# Patient Record
Sex: Female | Born: 1975
Health system: Southern US, Community
[De-identification: ages and names within clinical notes are randomized; demographics above are authoritative.]

## PROBLEM LIST (undated history)

## (undated) ENCOUNTER — Emergency Department: Admission: EM | Payer: BLUE CROSS/BLUE SHIELD

## (undated) DIAGNOSIS — M25569 Pain in unspecified knee: Secondary | ICD-10-CM

## (undated) DIAGNOSIS — M797 Fibromyalgia: Secondary | ICD-10-CM

## (undated) DIAGNOSIS — N3010 Interstitial cystitis (chronic) without hematuria: Secondary | ICD-10-CM

## (undated) DIAGNOSIS — G8929 Other chronic pain: Secondary | ICD-10-CM

## (undated) DIAGNOSIS — F419 Anxiety disorder, unspecified: Secondary | ICD-10-CM

## (undated) DIAGNOSIS — IMO0002 Reserved for concepts with insufficient information to code with codable children: Secondary | ICD-10-CM

## (undated) DIAGNOSIS — M1990 Unspecified osteoarthritis, unspecified site: Secondary | ICD-10-CM

## (undated) HISTORY — PX: NASAL SEPTUM SURGERY: SHX37

## (undated) HISTORY — PX: CERVICAL BIOPSY  W/ LOOP ELECTRODE EXCISION: SUR135

## (undated) HISTORY — PX: BRAIN SURGERY: SHX531

## (undated) HISTORY — PX: NOSE SURGERY: SHX723

---

## 1999-06-18 ENCOUNTER — Other Ambulatory Visit: Admission: RE | Admit: 1999-06-18 | Discharge: 1999-06-18 | Payer: Self-pay | Admitting: *Deleted

## 1999-07-17 ENCOUNTER — Other Ambulatory Visit: Admission: RE | Admit: 1999-07-17 | Discharge: 1999-07-17 | Payer: Self-pay | Admitting: *Deleted

## 1999-09-11 ENCOUNTER — Encounter (INDEPENDENT_AMBULATORY_CARE_PROVIDER_SITE_OTHER): Payer: Self-pay | Admitting: Specialist

## 1999-09-11 ENCOUNTER — Other Ambulatory Visit: Admission: RE | Admit: 1999-09-11 | Discharge: 1999-09-11 | Payer: Self-pay | Admitting: *Deleted

## 2000-05-29 ENCOUNTER — Encounter: Admission: RE | Admit: 2000-05-29 | Discharge: 2000-05-29 | Payer: Self-pay | Admitting: Obstetrics

## 2000-07-21 ENCOUNTER — Encounter (INDEPENDENT_AMBULATORY_CARE_PROVIDER_SITE_OTHER): Payer: Self-pay | Admitting: Specialist

## 2000-07-21 ENCOUNTER — Ambulatory Visit (HOSPITAL_COMMUNITY): Admission: RE | Admit: 2000-07-21 | Discharge: 2000-07-21 | Payer: Self-pay | Admitting: Obstetrics

## 2000-10-14 ENCOUNTER — Encounter: Admission: RE | Admit: 2000-10-14 | Discharge: 2000-10-14 | Payer: Self-pay | Admitting: Internal Medicine

## 2000-12-02 ENCOUNTER — Encounter: Admission: RE | Admit: 2000-12-02 | Discharge: 2000-12-02 | Payer: Self-pay | Admitting: Obstetrics & Gynecology

## 2001-04-20 ENCOUNTER — Encounter: Payer: Self-pay | Admitting: Emergency Medicine

## 2001-04-20 ENCOUNTER — Emergency Department (HOSPITAL_COMMUNITY): Admission: EM | Admit: 2001-04-20 | Discharge: 2001-04-20 | Payer: Self-pay | Admitting: Emergency Medicine

## 2003-03-07 ENCOUNTER — Other Ambulatory Visit: Admission: RE | Admit: 2003-03-07 | Discharge: 2003-03-07 | Payer: Self-pay | Admitting: *Deleted

## 2005-03-06 ENCOUNTER — Other Ambulatory Visit: Admission: RE | Admit: 2005-03-06 | Discharge: 2005-03-06 | Payer: Self-pay | Admitting: *Deleted

## 2005-06-04 ENCOUNTER — Emergency Department (HOSPITAL_COMMUNITY): Admission: EM | Admit: 2005-06-04 | Discharge: 2005-06-04 | Payer: Self-pay | Admitting: Emergency Medicine

## 2008-06-15 ENCOUNTER — Other Ambulatory Visit: Admission: RE | Admit: 2008-06-15 | Discharge: 2008-06-15 | Payer: Self-pay | Admitting: Gynecology

## 2008-08-11 ENCOUNTER — Ambulatory Visit (HOSPITAL_COMMUNITY): Admission: RE | Admit: 2008-08-11 | Discharge: 2008-08-11 | Payer: Self-pay | Admitting: Sports Medicine

## 2008-09-28 ENCOUNTER — Emergency Department (HOSPITAL_BASED_OUTPATIENT_CLINIC_OR_DEPARTMENT_OTHER): Admission: EM | Admit: 2008-09-28 | Discharge: 2008-09-28 | Payer: Self-pay | Admitting: Emergency Medicine

## 2008-10-16 ENCOUNTER — Ambulatory Visit: Payer: Self-pay | Admitting: Interventional Radiology

## 2008-10-16 ENCOUNTER — Emergency Department (HOSPITAL_BASED_OUTPATIENT_CLINIC_OR_DEPARTMENT_OTHER): Admission: EM | Admit: 2008-10-16 | Discharge: 2008-10-16 | Payer: Self-pay | Admitting: Emergency Medicine

## 2008-11-10 ENCOUNTER — Encounter: Admission: RE | Admit: 2008-11-10 | Discharge: 2008-11-10 | Payer: Self-pay | Admitting: Sports Medicine

## 2008-11-28 ENCOUNTER — Encounter
Admission: RE | Admit: 2008-11-28 | Discharge: 2009-02-26 | Payer: Self-pay | Admitting: Physical Medicine & Rehabilitation

## 2008-11-28 ENCOUNTER — Ambulatory Visit: Payer: Self-pay | Admitting: Physical Medicine & Rehabilitation

## 2008-12-14 ENCOUNTER — Ambulatory Visit: Payer: Self-pay | Admitting: Physical Medicine & Rehabilitation

## 2008-12-21 ENCOUNTER — Encounter
Admission: RE | Admit: 2008-12-21 | Discharge: 2009-03-21 | Payer: Self-pay | Admitting: Physical Medicine & Rehabilitation

## 2009-01-04 ENCOUNTER — Encounter
Admission: RE | Admit: 2009-01-04 | Discharge: 2009-01-05 | Payer: Self-pay | Admitting: Physical Medicine & Rehabilitation

## 2009-01-05 ENCOUNTER — Ambulatory Visit: Payer: Self-pay | Admitting: Physical Medicine & Rehabilitation

## 2009-02-17 ENCOUNTER — Ambulatory Visit: Payer: Self-pay | Admitting: Physical Medicine & Rehabilitation

## 2009-03-21 ENCOUNTER — Emergency Department (HOSPITAL_BASED_OUTPATIENT_CLINIC_OR_DEPARTMENT_OTHER): Admission: EM | Admit: 2009-03-21 | Discharge: 2009-03-21 | Payer: Self-pay | Admitting: Emergency Medicine

## 2009-03-21 ENCOUNTER — Ambulatory Visit: Payer: Self-pay | Admitting: Diagnostic Radiology

## 2009-03-22 ENCOUNTER — Encounter
Admission: RE | Admit: 2009-03-22 | Discharge: 2009-06-20 | Payer: Self-pay | Admitting: Physical Medicine & Rehabilitation

## 2009-03-24 ENCOUNTER — Ambulatory Visit: Payer: Self-pay | Admitting: Physical Medicine & Rehabilitation

## 2009-05-22 ENCOUNTER — Ambulatory Visit: Payer: Self-pay | Admitting: Physical Medicine & Rehabilitation

## 2009-08-14 ENCOUNTER — Ambulatory Visit: Payer: Self-pay | Admitting: Physical Medicine & Rehabilitation

## 2009-08-14 ENCOUNTER — Encounter
Admission: RE | Admit: 2009-08-14 | Discharge: 2009-11-01 | Payer: Self-pay | Admitting: Physical Medicine & Rehabilitation

## 2009-09-12 ENCOUNTER — Ambulatory Visit: Payer: Self-pay | Admitting: Physical Medicine & Rehabilitation

## 2009-11-10 ENCOUNTER — Encounter
Admission: RE | Admit: 2009-11-10 | Discharge: 2010-02-08 | Payer: Self-pay | Admitting: Physical Medicine & Rehabilitation

## 2009-11-13 ENCOUNTER — Ambulatory Visit: Payer: Self-pay | Admitting: Physical Medicine & Rehabilitation

## 2010-03-05 ENCOUNTER — Encounter
Admission: RE | Admit: 2010-03-05 | Discharge: 2010-03-07 | Payer: Self-pay | Admitting: Physical Medicine & Rehabilitation

## 2010-03-07 ENCOUNTER — Ambulatory Visit: Payer: Self-pay | Admitting: Physical Medicine & Rehabilitation

## 2010-05-15 ENCOUNTER — Emergency Department (HOSPITAL_BASED_OUTPATIENT_CLINIC_OR_DEPARTMENT_OTHER): Admission: EM | Admit: 2010-05-15 | Discharge: 2010-05-15 | Payer: Self-pay | Admitting: Emergency Medicine

## 2010-05-15 ENCOUNTER — Ambulatory Visit: Payer: Self-pay | Admitting: Diagnostic Radiology

## 2010-05-16 ENCOUNTER — Encounter
Admission: RE | Admit: 2010-05-16 | Discharge: 2010-05-25 | Payer: Self-pay | Admitting: Physical Medicine & Rehabilitation

## 2010-05-25 ENCOUNTER — Ambulatory Visit: Payer: Self-pay | Admitting: Physical Medicine & Rehabilitation

## 2010-08-13 ENCOUNTER — Ambulatory Visit: Payer: Self-pay | Admitting: Diagnostic Radiology

## 2010-08-13 ENCOUNTER — Emergency Department (HOSPITAL_BASED_OUTPATIENT_CLINIC_OR_DEPARTMENT_OTHER): Admission: EM | Admit: 2010-08-13 | Discharge: 2010-08-13 | Payer: Self-pay | Admitting: Emergency Medicine

## 2010-09-13 ENCOUNTER — Encounter
Admission: RE | Admit: 2010-09-13 | Discharge: 2010-09-19 | Payer: Self-pay | Source: Home / Self Care | Attending: Physical Medicine & Rehabilitation | Admitting: Physical Medicine & Rehabilitation

## 2010-09-19 ENCOUNTER — Ambulatory Visit: Payer: Self-pay | Admitting: Physical Medicine & Rehabilitation

## 2010-11-21 ENCOUNTER — Ambulatory Visit: Admit: 2010-11-21 | Payer: Self-pay | Admitting: Physical Medicine & Rehabilitation

## 2010-12-21 ENCOUNTER — Encounter: Payer: PRIVATE HEALTH INSURANCE | Attending: Physical Medicine & Rehabilitation

## 2010-12-21 ENCOUNTER — Ambulatory Visit (HOSPITAL_BASED_OUTPATIENT_CLINIC_OR_DEPARTMENT_OTHER): Payer: PRIVATE HEALTH INSURANCE

## 2010-12-21 DIAGNOSIS — M545 Low back pain, unspecified: Secondary | ICD-10-CM | POA: Insufficient documentation

## 2010-12-21 DIAGNOSIS — IMO0001 Reserved for inherently not codable concepts without codable children: Secondary | ICD-10-CM

## 2010-12-21 DIAGNOSIS — G4700 Insomnia, unspecified: Secondary | ICD-10-CM | POA: Insufficient documentation

## 2010-12-21 DIAGNOSIS — F411 Generalized anxiety disorder: Secondary | ICD-10-CM | POA: Insufficient documentation

## 2010-12-21 DIAGNOSIS — M5382 Other specified dorsopathies, cervical region: Secondary | ICD-10-CM

## 2010-12-21 DIAGNOSIS — M5380 Other specified dorsopathies, site unspecified: Secondary | ICD-10-CM

## 2011-01-20 LAB — COMPREHENSIVE METABOLIC PANEL
AST: 27 U/L (ref 0–37)
Albumin: 4.2 g/dL (ref 3.5–5.2)
BUN: 12 mg/dL (ref 6–23)
CO2: 25 mEq/L (ref 19–32)
Creatinine, Ser: 1.1 mg/dL (ref 0.4–1.2)
GFR calc Af Amer: >60 mL/min (ref >60)
Sodium: 142 mEq/L (ref 135–145)

## 2011-01-20 LAB — URINALYSIS, ROUTINE W REFLEX MICROSCOPIC
Bilirubin Urine: NEGATIVE
Hgb urine dipstick: NEGATIVE
Ketones, ur: NEGATIVE mg/dL
Urobilinogen, UA: 0.2 mg/dL (ref 0.0–1.0)

## 2011-01-20 LAB — DIFFERENTIAL
Eosinophils Relative: 2 % (ref 0–5)
Lymphocytes Relative: 46 % (ref 12–46)
Lymphs Abs: 3.5 10*3/uL (ref 0.7–4.0)
Neutro Abs: 3.2 10*3/uL (ref 1.7–7.7)
Neutrophils Relative %: 44 % (ref 43–77)

## 2011-01-20 LAB — CBC
HCT: 38.2 % (ref 36.0–46.0)
Hemoglobin: 13.2 g/dL (ref 12.0–15.0)
MCHC: 34.5 g/dL (ref 30.0–36.0)
MCV: 89.5 fL (ref 78.0–100.0)
Platelets: 270 10*3/uL (ref 150–400)
RBC: 4.27 MIL/uL (ref 3.87–5.11)
RDW: 12.9 % (ref 11.5–15.5)
WBC: 7.4 10*3/uL (ref 4.0–10.5)

## 2011-01-20 LAB — LIPASE, BLOOD: Lipase: 35 U/L (ref 23–300)

## 2011-01-29 ENCOUNTER — Ambulatory Visit: Payer: PRIVATE HEALTH INSURANCE | Admitting: Physical Medicine & Rehabilitation

## 2011-02-12 LAB — BASIC METABOLIC PANEL
Creatinine, Ser: 1.1 mg/dL (ref 0.4–1.2)
GFR calc non Af Amer: 57 mL/min — ABNORMAL LOW (ref >60)

## 2011-02-12 LAB — D-DIMER, QUANTITATIVE: D-Dimer, Quant: <0.22 ug/mL-FEU (ref 0.00–0.48)

## 2011-02-12 LAB — DIFFERENTIAL
Basophils Relative: 1 % (ref 0–1)
Eosinophils Relative: 1 % (ref 0–5)
Monocytes Relative: 9 % (ref 3–12)
Neutro Abs: 2.7 10*3/uL (ref 1.7–7.7)
Neutrophils Relative %: 49 % (ref 43–77)

## 2011-02-12 LAB — CBC
Hemoglobin: 13.9 g/dL (ref 12.0–15.0)
MCHC: 34.1 g/dL (ref 30.0–36.0)
RBC: 4.45 MIL/uL (ref 3.87–5.11)
WBC: 5.4 10*3/uL (ref 4.0–10.5)

## 2011-02-12 LAB — POCT CARDIAC MARKERS: Troponin i, poc: <0.05 ng/mL (ref 0.00–0.09)

## 2011-02-26 ENCOUNTER — Encounter
Payer: PRIVATE HEALTH INSURANCE | Attending: Physical Medicine & Rehabilitation | Admitting: Physical Medicine & Rehabilitation

## 2011-02-26 DIAGNOSIS — F341 Dysthymic disorder: Secondary | ICD-10-CM

## 2011-02-26 DIAGNOSIS — G8929 Other chronic pain: Secondary | ICD-10-CM | POA: Insufficient documentation

## 2011-02-26 DIAGNOSIS — F411 Generalized anxiety disorder: Secondary | ICD-10-CM | POA: Insufficient documentation

## 2011-02-26 DIAGNOSIS — G4700 Insomnia, unspecified: Secondary | ICD-10-CM | POA: Insufficient documentation

## 2011-02-26 DIAGNOSIS — M5380 Other specified dorsopathies, site unspecified: Secondary | ICD-10-CM

## 2011-02-26 DIAGNOSIS — IMO0001 Reserved for inherently not codable concepts without codable children: Secondary | ICD-10-CM

## 2011-02-26 DIAGNOSIS — M545 Low back pain, unspecified: Secondary | ICD-10-CM | POA: Insufficient documentation

## 2011-02-26 NOTE — Assessment & Plan Note (Signed)
Brandi Welch is back regarding her fibromyalgia.  She continues to have some left neck pain.  She had good results with the trigger point injections at last visit, but has had some more tightness over the last few weeks.  She has done some stretching and some range of motion, but still has pain.  Denies any weakness or sensory symptoms in the left upper extremity.  Her pain is described as sharp, burning, tingling, and aching.  Sleep is poor.  She does have anxiety.  We had started on Klonopin back in November, but only gave her, I believe a refill on this.  The Klonopin seemed to work very well.  She is seeing a Sleep Clinic having poor results.  REVIEW OF SYSTEMS:  Notable for anxiety, weakness.  GENERAL:  Weight gain.  Full 12-point review is in the written health and history section of the chart.  SOCIAL HISTORY:  Unchanged.  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  PHYSICAL EXAMINATION:  VITAL SIGNS:  Blood pressure is 122/69, pulse 80, respiratory rate 18, she is satting 98% on room air. GENERAL:  The patient is pleasant, alert, weight is stable. MUSCULOSKELETAL:  She has some tightness over the left levator scapula muscle.  Strength is 5/5 in both upper extremities.  Normal sensory function.  Range of motion is good. PSYCHIATRIC:  Cognitively, she is alert and appropriate. HEART:  Regular. CHEST:  Clear. ABDOMEN:  Soft, nontender.  ASSESSMENT: 1. Fibromyalgia with myofascial pain. 2. Chronic low back pain. 3. Anxiety disorder with insomnia.  PLAN: 1. Recommended that the patient discontinue her dietary supplement     which I believe this causes some headache. 2. We will initiate the Klonopin for anxiety at nighttime and I have     discussed relaxation techniques.  She also needs to follow up with     her neuropsychologist. 3. Refill Percocet to be filled on Mar 12, 2011, or afterwards. 4. We talked about good posture, range of motion, as well as  stretching at home. 5. After informed consent, I injected left levator scapula with two     separate trigger point injections consisting of 2 mL of 1%     lidocaine.  The patient tolerated well. 6. I will see her back in about 2 months' time.     Brandi Welch, M.D. Electronically Signed    ZTS/MedQ D:  02/26/2011 09:55:41  T:  02/26/2011 22:20:02  Job #:  [id number]

## 2011-03-19 NOTE — Procedures (Signed)
Brandi Welch, Brandi Welch            ACCOUNT NO.:  [id number]   MEDICAL RECORD NO.:  [id number]          PATIENT TYPE:  REC   LOCATION:  [facility]                          FACILITY:  [facility]   PHYSICIAN:  Erick Colace, M.D.DATE OF BIRTH:  Dec 06, 1975   DATE OF PROCEDURE:  01/05/2009  DATE OF DISCHARGE:                               OPERATIVE REPORT   PROCEDURE:  Bilateral L5 dorsal ramus injection, bilateral L4 medial  branch block, bilateral L3 medial branch block under fluoroscopic  guidance.   INDICATION:  Lumbar pain.  The pain interferes with self-care mobility,  persist despite medication.   Informed consent was obtained after describing risks and benefits of the  procedure with the patient.  These include bleeding, bruising, and  infection.  She elects to proceed and has given written consent.  The  patient was placed prone on fluoroscopy table.  Betadine prep, sterile  drape.  A 25-gauge 1-1/2-inch needle was used to anesthetize the skin  and subcu tissue 1% lidocaine x2 mL.  Then, a 22-gauge 3-1/2-inch spinal  needle was inserted under fluoroscopic guidance targeting the right S1  SAP sacroiliac junction.  Bone contact made, confirmed with lateral  imaging.  Omnipaque 180 under live fluoro demonstrated no intravascular  uptake and 0.5 mL of solution containing 1 mL of 4 mg/mL dexamethasone,  2 mL of 2% lidocaine were injected.  Then, the right L5 SAP transverse  process junction targeted.  Bone contact made, confirmed with lateral  imaging.  Omnipaque 180 under live fluoro demonstrated no intravascular  uptake and 0.5 mL dexamethasone and lidocaine solution was injected.  Then, the right L4 SAP transverse process junction targeted.  Bone  contact made, confirmed with lateral imaging.  Omnipaque 180 under live  fluoro demonstrated no intravascular uptake and 0.5 mL of dexamethasone  and lidocaine solution was injected.  Same procedure was repeated on the  left side at the  corresponding levels using same needle injectate and  technique.  The patient tolerated procedure well.  Pre- and post-  injection vitals stable.  Pre-injection pain level 8/10.  Post-injection  pain level is 0/10.  We will let her go back to work tomorrow.      Erick Colace, M.D.  Electronically Signed     AEK/MEDQ  D:  01/05/2009 10:43:14  T:  01/05/2009 21:18:08  Job:  [id number]

## 2011-03-19 NOTE — Assessment & Plan Note (Signed)
[name] is back regarding her fibromyalgia syndrome.  She called  the last month regarding using antibiotics for her fibromyalgia  treatment.  She stated that she had used some for an infection and her  pain has gone down dramatically.  I told her that I would not prescribe  her chronic antibiotics for such a reason.  We discussed that further  today and the patient understands.  She did have her medial-branch  blocks without significant benefit at this point.  She did like trigger  point injections, we performed at the trapezius and rhomboid areas in  February.  Topamax has helped somewhat with her sleep.  She wants to use  her Percocet more often for breakthrough pain.  Celexa seem to work well  for her mood standpoint.  She is trying to wean off the Valium.  She  tried over-the-counter melatonin without a lot of success at night.  The  patient rates her pain 8-9/10 on average and describes as a tingling,  aching, sharp, and burning.  Pain interferes with general activity,  relations with others, and enjoyment of life on a severe level.  Sleep  is poor.   SOCIAL HISTORY:  The patient was fired from her job and this has caused  some undue distress.  She is also still preparing for her wedding.   REVIEW OF SYSTEMS:  Notable for numbness, tingling, weakening,  confusion, depression, and anxiety.  She has had some gynecological  issues apparently infection in here uterus related to her prior  abortion.  Other pertinent positives are above.  Full review is in the  written health and history section of the chart.   PHYSICAL EXAMINATION:  VITAL SIGNS:  Blood pressure is 143/98, pulse is  100, respiratory rate 18, and she is sating 100% on room air.  GENERAL:  The patient is pleasant, alert, and oriented x3.  Weight is  generally stable.  EXTREMITIES:  She had multiple tender regions today, particularly in the  lower lumbar spine as well as the upper trapezius areas which were  worsened with deep palpation.  Notable for spasm and tightness as noted  on these areas as well.  She had fair back range of motion today,  although still limited more than extension and flexion.  Strength is 5/5  with normal reflexes and no obvious sensory loss.  Spurling test was  negative as was straight leg raising.  She remains overweight.  HEART:  Regular.  CHEST:  Clear.  ABDOMEN:  Soft and nontender.  PSYCH:  Mood was generally appropriate.   ASSESSMENT:  1. Fibromyalgia syndrome.  2. Chronic low back pain.  Possibly large myofascial component      especially considering she did not respond to the medial-branch      blocks whatsoever.  3. Anxiety disorder.  4. Myofascial pain.  5. Insomnia.  6. Obesity.   PLAN:  1. Again, we injected at trapezius areas with 2 separate trigger      points in each side consisting of 1 mL of 1% lidocaine.      Additionally, we injected the lower lumbar paraspinals at L4-L5 and      L5-S1 with 1.5 mL of lidocaine at each area.  A total of 8      injections were performed today.  2. We will increase Topamax to 200 mg nightly for mood stability and      sleep.  Continue same Celexa 20 mg nightly.  3. Refill Percocet 5/325 one  q.6 h. P.r.n., #90.  We discussed at      length and I do not want her become dependent on the Percocet      throughout the day and already she is seeing a rebound pain      phenomenon with when she is using.  4. Wean Valium to off.  5. Begin a trial of Mestinon 30 mg daily at 7 a.m. and 12 noon,      titrating up to 60 mg as tolerated.  6. I gave her a prescription for Nasonex for allergies.  7. Introduce Soma 350 mg q.8 h. p.r.n. for spasm.  8. Encouraged the patient to use her time now to work on range of      motion, stretching, aerobic exercise, and weight loss.  9. I will see her back in about one month's time.       Ranelle Oyster, M.D.  Electronically Signed     ZTS/MedQ  D:  02/17/2009 13:13:49   T:  02/18/2009 04:35:36  Job #:  [id number]   cc:   Forrest Moron  Fax: [phone]

## 2011-03-19 NOTE — Assessment & Plan Note (Signed)
Brandi Welch is back regarding her fibromyalgia syndrome.  She just was  married and her overall pain has been a bit decreased.  She is feeling  better about herself, less anxiety better.  She is taking Celexa only 20  mg daily, as she found to have some movement problems and tremors  related to the medication.  Still has a problem sleeping, mostly due to  her low back and pelvic pain.  She usually sleeps on her side.  Pain is  described as sharp, stabbing, and aching.  Pain interferes with general  activity, relations with others, enjoyment of life on a moderate level.  She generally gets about 4-6 hours of night sleep.  She states that she  can walk about 20 minutes without having stop.  She is still not  employed.   REVIEW OF SYSTEMS:  Notable for anxiety and some potential weight loss.  Other pertinent positives are above and full review is in the written  health and history section of the chart.   SOCIAL HISTORY:  The patient is married and no other changes noted.   PHYSICAL EXAMINATION:  VITAL SIGNS:  Blood pressure 143/75, pulse is 99,  and respiratory rate is 18.  She is sating 99% on room air.  GENERAL:  The patient is pleasant, alert, and oriented x3.  EXTREMITIES:  She continues to have pain over the right PSIS area as  well as IS area or as well as lower lumbar paraspinal musculature and  facets on the right.  She had relief with flexion and more pain with  extension and facet maneuvers today.  Side bending cause some discomfort  as well.  Strength is 5/5 in both legs with normal sensory function.  Cognitively, she is appropriate.  Weight was unchanged.  HEART:  Regular.  CHEST:  Clear.  ABDOMEN:  Soft, nontender.   ASSESSMENT:  1. Fibromyalgia syndrome.  2. Chronic low back pain which appears more facet in nature, although      cannot rule out sacroiliac joint causes.  3. Anxiety disorder.  4. Myofascial pain.  5. Insomnia.  6. Obesity.   PLAN:  1. As the patient  still has no insurance, we will look towards      aggressive home exercise program to work on core muscle stretching,      strengthening, and lengthening, etc.  Really, this is where the key      is for her.  2. We will maintain Celexa 20 mg nightly with Topamax 200 mg nightly.  3. Refilled Percocet 5/325 one q.6 h. p.r.n. #90.  4. Ultimately, I would recommend joint injection and/or therapy for      her back.  5. I will see back in about 3 months' time.  She is not due for until      approximately July 02, 2009, for next Percocet refill after the      prescription I gave her today.      Ranelle Oyster, M.D.  Electronically Signed     ZTS/MedQ  D:  05/22/2009 11:12:31  T:  05/23/2009 00:28:21  Job #:  [id number]   cc:   Forrest Moron  Fax: [phone]

## 2011-03-19 NOTE — Assessment & Plan Note (Signed)
Brandi Welch is back regarding her multiple pain complaints.  I saw her  less than a month ago.  She has had sudden increase in her pain over the  last few weeks.  She states that she had nice results with the trigger  point injections for approximately 10 days.  I asked her what has  increased her pain and she had a hard time identifying that and  eventually admitted that there have been a lot of stresses at home  related to money issues, etc.  She said that she cannot function with  the level of pain as it is currently.  She states the pain is most  prominent in the upper and low back.  Pain averages 9-10/10.  Pain is  sharp, stabbing, dull, constant, tingling.  Pain interferes with general  activity, relations with others, and enjoyment of life on a severe  level.  Sleep is poor.  She did note much change with the Rozerem to  this point.  She has had no problems tolerating Topamax, although she is  at only 50 mg at bedtime at this point.  She continues Relafen 500 mg  b.i.d.  Voltaren gel was not helpful.  She has had some nausea over the  last few days, but unsure of the source.   REVIEW OF SYSTEMS:  Notable for severe anxiety, nausea, vomiting, weight  gain overall.   SOCIAL HISTORY:  The patient works as a Tourist information centre manager still and will  need assistance with number of teaching and household chores, etc.  Her  fiancee is with her today.   PHYSICAL EXAMINATION:  VITAL SIGNS:  Blood pressure is 166/89, pulse 62,  respiratory rate 18.  She is sating 100% on room air.  Weight was  generally stable.  GENERAL:  When I walked in the room, the patient was very tearful and  anxious.  EXTREMITIES:  After we talked for a while, she improved quite a bit.  She had tender points throughout with trigger points over both trapezius  muscles and mid to upper rhomboids totaling 4 in general today.  Posture  was fair.  The patient had pain with lumbar flexion more so with  extension and facet maneuvers  today.  Strength was really 5/5 in all 4  limbs with 1+ to 2+ reflexes throughout.  No sensory loss.  Spurling's  test was negative and straight leg testing was equivocal.  HEART:  Regular.  CHEST:  Clear.  ABDOMEN:  Soft, nontender.  NEUROLOGIC:  Cognitively, she was generally intact when she settled  down.   ASSESSMENT:  1. Likely fibromyalgia syndrome.  2. Chronic low back pain, which continues to be more consistent with      facet disease.  Her lumbar MRI report I acquired revealed facet      hypertrophy from L3-L4 to L5-S1.  3. Anxiety disorder.  4. Myofascial pain.  5. Insomnia.  6. Obesity.   PLAN:  1. After informed consent, we injected trigger points again in the      bilateral trapezius and rhomboid areas using 2 mL of 1% lidocaine      for his injection.  The patient tolerated well.  2. We will increase Topamax to 100 mg at bedtime for mood stability      and for sleep as well as pain.  3. The patient was given Percocet 5/325 one q.6 h. p.r.n. for      breakthrough symptoms, #75.  4. We need to get her anxiety  as a whole under better control.  I      started Celexa 20 mg at bedtime.  Continue with Valium for now.  I      made a referral to Dr. Eula Flax for mood and coping      strategies.  The patient seemed very grateful for that.  5. I will send the patient to Dr. Wynn Banker for bilateral L3-L4 and L4-      L5 medial branch blocks.  6. We will continue with Relafen for now.  She can use Rozerem until      it runs out.  She has a problem with the      brand name copays.  7. I will see her back pending injections above.  An extensive amount      of time was spent with the patient today.      Ranelle Oyster, M.D.  Electronically Signed     ZTS/MedQ  D:  12/14/2008 14:03:42  T:  12/15/2008 01:43:54  Job #:  [id number]   cc:   Forrest Moron  Fax: [phone]

## 2011-03-19 NOTE — Assessment & Plan Note (Signed)
Brandi Welch is back regarding her fibromyalgia syndrome.  As a whole, she  is doing fairly well with the medication changes.  She was unable to  tolerate the Mestinon due to diarrhea and a bit of anxiety, so she  backed off this, although she noted when she did use that her energy was  better.  She was in the ER Monday for chest pain which turned out to be  anxiety.  In talking to her, it may have also been reflux after she went  out to eat.  Her pain is 6-7/10 today, described as aching and burning.  She has pain of her shoulders, back, legs.  Pain interferes with general  activity, relations with others, enjoyment of life on a moderate level.  She does feel in general her fibromyalgia pain has improved, although  when she does become more active, her back will act up.  Sleep is poor  at times and she notes that her husband frequently snores at night which  keeps her awake.   REVIEW OF SYSTEMS:  Notable for the above as well as dizziness, anxiety,  coughing, shortness of breath.  Other pertinent positives are above and  full 14- point review is in the written health and history section of  the chart.   Social history is essentially unchanged.  She plans on being married in  July and has some anxiety over this.   PHYSICAL EXAMINATION:  VITAL SIGNS:  Blood pressure is 148/98, pulse is  90, respiratory rate 18.  She is sating 94% on room air.  GENERAL:  The patient is pleasant, alert, and oriented x3.  Affect is  generally bright and appropriate.  She remains a bit anxious, but is  generally looking good today.  EXTREMITIES:  She has no notable spasm in the lower lumbar spine,  although the upper lumbar and thoracic spine was positive for some  tightness and rotation to the right.  She had pain over both PSIS areas  as well as with extension more than flexion today.  Lateral bending also  caused some discomfort.  Patrick's test were positive bilaterally.  Straight leg testing was  unremarkable.  Strength is 5/5 with 2+  reflexes.  Normal sensation in both legs today.  She had some pain  through her arms and shoulders as usual as well today.  She has been  compatible with her tender points.  HEART:  Regular.  CHEST:  Clear.  ABDOMEN:  Soft, nontender.   ASSESSMENT:  1. Fibromyalgia syndrome.  2. Chronic low back pain.  The patient may be suffering from an      sacroiliac joint issue.  She also has some facet signs still on      exam.  3. Anxiety disorder.  4. Myofascial pain.  5. Insomnia.  6. Obesity.   PLAN:  1. Until we get her sleep and anxiety under better control, I think      she will still continue to have some struggles.  She has witnessed      again by her ER admission, her anxiety plays a big role on her      somatic complaints.  We would like to increase Celexa to 40 mg      nightly.  We will stay with Topamax current dose of 200 mg at      night.  The patient is given back exercises today to work on core,      low back stretching, and range of motion.  2. Refilled Percocet 5 one q.h. p.r.n. #90 for breakthrough pain.  3. We would like to get the patient at physical therapy, but she does      not have insurance at this point.  She will check with her fiance      regarding cost and such with this.  4. The patient also would benefit from psychological counseling to      work on stress management and coping      strategies.  The patient was on board with all the above      suggestions.  5. I will see her back in about 2 months' time.  In general, she has      improved.      Ranelle Oyster, M.D.  Electronically Signed     ZTS/MedQ  D:  03/24/2009 11:13:24  T:  03/25/2009 00:58:14  Job #:  [id number]   cc:   Forrest Moron  Fax: [phone]

## 2011-03-22 NOTE — Op Note (Signed)
[facility]  Patient:    Brandi Welch, Brandi Welch                MRN: [id number] Proc. Date: 07/21/00 Adm. Date:  [id number] Attending:  Tammi Sou CC:         [facility]   Operative Report  PREOPERATIVE DIAGNOSIS:         High grade cervical intraepithelial lesion (cervical intraepithelial neoplasia II) with positive endocervical curettage (positive endocervical cone).  POSTOPERATIVE DIAGNOSIS:        High grade cervical intraepithelial lesion (cervical intraepithelial neoplasia II) with positive endocervical curettage (positive endocervical cone).  OPERATION:                      Loop electrical excision procedure with paracervcial block.  SURGEON:                        Charles A. Clearance Coots, M.D.  COMPLICATIONS:                  None.  SPECIMENS:                      Conization of cervix.  DESCRIPTION OF PROCEDURE:       The patient was brought to the operating room and after satisfactory IV sedation the legs were brought up in stirrups and the vagina was prepped and draped in the routine sterile fashion.  A sterile LEEP speculum was inserted in the vaginal vault and the cervix was isolated. The cervix was infiltrated with a solution of 1% Xylocaine with 1:200,000 epinephrine throughout the cervical stroma.  A 15 x 12 mm loop was then used to obtain a conization specimen of the cervix in a routine fashion at a wattage of 70 watts cutting and 50 watts coag.  The specimen was removed and labeled at 12 oclock position with suture and submitted to pathology for evaluation.  The conization bed was then feathered on the edges with ball-tip electrode at 50 watts coag.  The base of the conization was hemostatic, and Monsels solution was applied to the conization bed.  There was no active bleeding at the conclusion of the procedure.  All instruments were retired. The patient tolerated the procedure well and was  transported to the recovery room in satisfactory condition. DD:  07/21/00 TD:  07/22/00 Job: [id number]

## 2011-04-23 ENCOUNTER — Encounter: Payer: PRIVATE HEALTH INSURANCE | Admitting: Physical Medicine & Rehabilitation

## 2011-05-27 ENCOUNTER — Encounter: Payer: PRIVATE HEALTH INSURANCE | Admitting: Physical Medicine & Rehabilitation

## 2011-06-25 ENCOUNTER — Encounter: Payer: PRIVATE HEALTH INSURANCE | Admitting: Neurosurgery

## 2011-06-27 DIAGNOSIS — M797 Fibromyalgia: Secondary | ICD-10-CM | POA: Insufficient documentation

## 2011-07-12 ENCOUNTER — Encounter: Payer: PRIVATE HEALTH INSURANCE | Attending: Neurosurgery | Admitting: Neurosurgery

## 2011-07-12 DIAGNOSIS — IMO0001 Reserved for inherently not codable concepts without codable children: Secondary | ICD-10-CM | POA: Insufficient documentation

## 2011-07-12 DIAGNOSIS — F411 Generalized anxiety disorder: Secondary | ICD-10-CM

## 2011-07-12 DIAGNOSIS — M549 Dorsalgia, unspecified: Secondary | ICD-10-CM | POA: Insufficient documentation

## 2011-07-12 DIAGNOSIS — G894 Chronic pain syndrome: Secondary | ICD-10-CM

## 2011-07-12 DIAGNOSIS — R52 Pain, unspecified: Secondary | ICD-10-CM | POA: Insufficient documentation

## 2011-07-13 NOTE — Assessment & Plan Note (Signed)
HISTORY:  The patient seen for Dr. Riley Kill, for sometime for fibromyalgia and generalized pain syndrome.  The patient has not been seen here since April, she was wished to followed up in June and did not.  She has had trigger point injections in the past.  She called asking for medicine refill which was denied and she came in for clinic followup to try to get those refill.  The patient was very tearful today and her pain has been worse.  I explained to the patient when she has worsening pain that she should be coming here for evaluation, still if not coming she states that she has had multiple problems and multiple things going on that prohibit her for coming to an appointment.  Another event occurred in which we checked the [facility] Control Substance Report that showed the patient has received narcotics from multiple providers since she was seen here last in April 9.  The patient did not deny that.  I have spoken to Dr. Riley Kill, who was kind enough to offer her another chance and being treated here with one last refill on Percocet today, and if she made anymore visits with other doctors or got treatment anywhere else she could be discharged.  The patient states that she does not want to be seen here again anyway, she wants to follow up with [facility] in [place], she already had their contact information.  I did make the referral, but she will probably have to get her primary care, there was no exam.  She does rate her pain at 9, it is sharp, burning, tingling, aching pain.  General activity level is 9-10, pain same 24 hours a day.  Sleep patterns are poor.  She states that rest tends to help but she did not indicate what aggravates her pain.  She walks without assistance.  She is employed about 10 hours a week.  She needs help with her ADLs and household duties.  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  PAST MEDICAL HISTORY, SOCIAL HISTORY, AND FAMILY HISTORY:  Unchanged.  PHYSICAL EXAMINATION:  VITAL SIGNS:  Blood pressure 157/105, pulse 76, respirations 18, and O2 sats 100 on room air. CONSTITUTIONAL:  She is obese.  She is alert and oriented x3, very depressed, very anxious and tearful.  ASSESSMENT: 1. History of fibromyalgia. 2. Chronic back pain. 3. Anxiety disorder.  PLAN:  As stated above, Dr. Riley Kill, did allow her to have one refill of Percocet 7.5/325 one p.o. q.6 h. p.r.n., 75 with no refill.  She is requested to be discharged from clinic, so she can follow up in clinic in [place], so we did go ahead and request her sign a release form, so the records can be sent if [facility] contacts [place]. She and her husband's questions were encouraged and answered and she will not follow up in this clinic.     Salik Grewell L. Blima Dessert     Ranelle Oyster, M.D. Electronically Signed  RLW/MedQ D:  07/12/2011 14:42:37  T:  07/13/2011 00:04:01  Job #:  [id number]

## 2011-12-18 DIAGNOSIS — I10 Essential (primary) hypertension: Secondary | ICD-10-CM | POA: Insufficient documentation

## 2012-01-22 DIAGNOSIS — M7918 Myalgia, other site: Secondary | ICD-10-CM | POA: Insufficient documentation

## 2012-01-22 DIAGNOSIS — M545 Low back pain, unspecified: Secondary | ICD-10-CM | POA: Insufficient documentation

## 2012-02-03 ENCOUNTER — Encounter: Payer: Self-pay | Admitting: *Deleted

## 2012-02-03 NOTE — Progress Notes (Signed)
Received fax request for refill of clonazepam. Last rx here was 06/28/11. Chart indicates that care was transferred to [facility] in [place]. Pharmacy notified no longer a patient here.

## 2012-03-12 DIAGNOSIS — E039 Hypothyroidism, unspecified: Secondary | ICD-10-CM | POA: Insufficient documentation

## 2012-03-12 HISTORY — DX: Hypothyroidism, unspecified: E03.9

## 2012-11-30 DIAGNOSIS — F411 Generalized anxiety disorder: Secondary | ICD-10-CM | POA: Insufficient documentation

## 2013-07-01 ENCOUNTER — Encounter (HOSPITAL_BASED_OUTPATIENT_CLINIC_OR_DEPARTMENT_OTHER): Payer: Self-pay | Admitting: *Deleted

## 2013-07-01 ENCOUNTER — Emergency Department (HOSPITAL_BASED_OUTPATIENT_CLINIC_OR_DEPARTMENT_OTHER)
Admission: EM | Admit: 2013-07-01 | Discharge: 2013-07-01 | Disposition: A | Discharge status: Home / Self Care | Payer: BC Managed Care – PPO | Attending: Emergency Medicine | Admitting: Emergency Medicine

## 2013-07-01 ENCOUNTER — Emergency Department (HOSPITAL_BASED_OUTPATIENT_CLINIC_OR_DEPARTMENT_OTHER): Payer: BC Managed Care – PPO

## 2013-07-01 DIAGNOSIS — Z87891 Personal history of nicotine dependence: Secondary | ICD-10-CM | POA: Insufficient documentation

## 2013-07-01 DIAGNOSIS — R630 Anorexia: Secondary | ICD-10-CM | POA: Insufficient documentation

## 2013-07-01 DIAGNOSIS — R109 Unspecified abdominal pain: Secondary | ICD-10-CM

## 2013-07-01 DIAGNOSIS — R350 Frequency of micturition: Secondary | ICD-10-CM | POA: Insufficient documentation

## 2013-07-01 DIAGNOSIS — N390 Urinary tract infection, site not specified: Secondary | ICD-10-CM | POA: Insufficient documentation

## 2013-07-01 DIAGNOSIS — Z3202 Encounter for pregnancy test, result negative: Secondary | ICD-10-CM | POA: Insufficient documentation

## 2013-07-01 DIAGNOSIS — R1031 Right lower quadrant pain: Secondary | ICD-10-CM | POA: Insufficient documentation

## 2013-07-01 DIAGNOSIS — Z79899 Other long term (current) drug therapy: Secondary | ICD-10-CM | POA: Insufficient documentation

## 2013-07-01 DIAGNOSIS — Z8739 Personal history of other diseases of the musculoskeletal system and connective tissue: Secondary | ICD-10-CM | POA: Insufficient documentation

## 2013-07-01 HISTORY — DX: Fibromyalgia: M79.7

## 2013-07-01 LAB — URINE MICROSCOPIC-ADD ON

## 2013-07-01 LAB — LIPASE, BLOOD: Lipase: 25 U/L (ref 11–59)

## 2013-07-01 LAB — CBC WITH DIFFERENTIAL/PLATELET
HCT: 39.8 % (ref 36.0–46.0)
Hemoglobin: 13.5 g/dL (ref 12.0–15.0)
Lymphocytes Relative: 28 % (ref 12–46)
Monocytes Absolute: 0.6 10*3/uL (ref 0.1–1.0)
Monocytes Relative: 6 % (ref 3–12)
Neutro Abs: 7.1 10*3/uL (ref 1.7–7.7)
Neutrophils Relative %: 66 % (ref 43–77)
RBC: 4.53 MIL/uL (ref 3.87–5.11)
WBC: 10.7 10*3/uL — ABNORMAL HIGH (ref 4.0–10.5)

## 2013-07-01 LAB — COMPREHENSIVE METABOLIC PANEL
Albumin: 4.3 g/dL (ref 3.5–5.2)
BUN: 8 mg/dL (ref 6–23)
Calcium: 9.8 mg/dL (ref 8.4–10.5)
Creatinine, Ser: 0.9 mg/dL (ref 0.50–1.10)
Potassium: 3.9 mEq/L (ref 3.5–5.1)
Total Protein: 7.6 g/dL (ref 6.0–8.3)

## 2013-07-01 LAB — URINALYSIS, ROUTINE W REFLEX MICROSCOPIC
Glucose, UA: NEGATIVE mg/dL
Hgb urine dipstick: NEGATIVE
Nitrite: POSITIVE — AB
Protein, ur: NEGATIVE mg/dL
Specific Gravity, Urine: 1.018 (ref 1.005–1.030)
pH: 7 (ref 5.0–8.0)

## 2013-07-01 MED ORDER — SODIUM CHLORIDE 0.9 % IV BOLUS (SEPSIS)
500.0000 mL | Freq: Once | INTRAVENOUS | Status: AC
  Administered 2013-07-01: 500 mL via INTRAVENOUS

## 2013-07-01 MED ORDER — IOHEXOL 300 MG/ML  SOLN
100.0000 mL | Freq: Once | INTRAMUSCULAR | Status: AC | PRN
  Administered 2013-07-01: 100 mL via INTRAVENOUS

## 2013-07-01 MED ORDER — ONDANSETRON HCL 4 MG/2ML IJ SOLN
4.0000 mg | Freq: Once | INTRAMUSCULAR | Status: AC
  Administered 2013-07-01: 4 mg via INTRAVENOUS
  Filled 2013-07-01: qty 2

## 2013-07-01 MED ORDER — HYDROMORPHONE HCL PF 1 MG/ML IJ SOLN
1.0000 mg | Freq: Once | INTRAMUSCULAR | Status: AC
  Administered 2013-07-01: 1 mg via INTRAVENOUS
  Filled 2013-07-01: qty 1

## 2013-07-01 MED ORDER — NITROFURANTOIN MONOHYD MACRO 100 MG PO CAPS: 100.0000 mg | ORAL_CAPSULE | Freq: Two times a day (BID) | ORAL | Status: DC

## 2013-07-01 MED ORDER — IOHEXOL 300 MG/ML  SOLN
50.0000 mL | Freq: Once | INTRAMUSCULAR | Status: AC | PRN
  Administered 2013-07-01: 50 mL via ORAL

## 2013-07-01 NOTE — ED Provider Notes (Signed)
CSN: [id number]     Arrival date & time 07/01/13  1236 History   First MD Initiated Contact with Patient 07/01/13 1252     Chief Complaint  Patient presents with  . Urinary Tract Infection   (Consider location/radiation/quality/duration/timing/severity/associated sxs/prior Treatment) HPI Comments: Pt states that she was diagnosed with a uti, 5 days ago by [facility]:pt states that they called and stated that the the urine was clean:pt states that she has urinary frequency, rlq abdominal pain and decrease apetite:pt states that the symptoms are worsening:denies vaginal discharge:pt unsure of fever  The history is provided by the patient. No language interpreter was used.    Past Medical History  Diagnosis Date  . Fibromyalgia    Past Surgical History  Procedure Laterality Date  . Nose surgery     No family history on file. History  Substance Use Topics  . Smoking status: Former Games developer  . Smokeless tobacco: Not on file  . Alcohol Use: No   OB History   Grav Para Term Preterm Abortions TAB SAB Ect Mult Living                 Review of Systems  Constitutional: Negative.   Respiratory: Negative.   Cardiovascular: Negative.     Allergies  Shellfish allergy  Home Medications   Current Outpatient Rx  Name  Route  Sig  Dispense  Refill  . acyclovir (ZOVIRAX) 200 MG capsule   Oral   Take 200 mg by mouth 2 (two) times daily.         Marland Kitchen alprazolam (XANAX) 2 MG tablet   Oral   Take 2 mg by mouth every morning.         . carisoprodol-aspirin (SOMA COMPOUND) 200-325 MG per tablet   Oral   Take 1 tablet by mouth 4 (four) times daily as needed for muscle spasms.         Marland Kitchen oxyCODONE (OXYCONTIN) 10 MG 12 hr tablet   Oral   Take 10 mg by mouth every 12 (twelve) hours.          BP 137/89  Pulse 99  Temp(Src) 98.8 F (37.1 C) (Oral)  Resp 22  Ht 5\' 7"  (1.702 m)  Wt 220 lb (99.791 kg)  BMI 34.45 kg/m2  SpO2 94%  LMP 06/23/2013 Physical Exam  Nursing note  and vitals reviewed. Constitutional: She is oriented to person, place, and time. She appears well-developed and well-nourished.  HENT:  Head: Normocephalic.  Neck: Neck supple.  Cardiovascular: Normal rate and regular rhythm.   Pulmonary/Chest: Effort normal and breath sounds normal.  Abdominal: Soft. Bowel sounds are normal. There is tenderness in the right lower quadrant.  Musculoskeletal: Normal range of motion.  Neurological: She is alert and oriented to person, place, and time.  Skin: Skin is warm and dry.    ED Course  Procedures (including critical care time) Labs Review Labs Reviewed  URINALYSIS, ROUTINE W REFLEX MICROSCOPIC - Abnormal; Notable for the following:    Color, Urine ORANGE (*)    APPearance CLOUDY (*)    Bilirubin Urine SMALL (*)    Ketones, ur 15 (*)    Nitrite POSITIVE (*)    Leukocytes, UA SMALL (*)    All other components within normal limits  COMPREHENSIVE METABOLIC PANEL - Abnormal; Notable for the following:    Glucose, Bld 105 (*)    Total Bilirubin 0.2 (*)    GFR calc non Af Amer 81 (*)    All  other components within normal limits  CBC WITH DIFFERENTIAL - Abnormal; Notable for the following:    WBC 10.7 (*)    All other components within normal limits  URINE MICROSCOPIC-ADD ON - Abnormal; Notable for the following:    Squamous Epithelial / LPF MANY (*)    Bacteria, UA MANY (*)    All other components within normal limits  URINE CULTURE  PREGNANCY, URINE  LIPASE, BLOOD   Imaging Review Ct Abdomen Pelvis W Contrast  07/01/2013   *RADIOLOGY REPORT*  Clinical Data: Right lower quadrant pain and vomiting.  CT ABDOMEN AND PELVIS WITH CONTRAST  Technique:  Multidetector CT imaging of the abdomen and pelvis was performed following the standard protocol during bolus administration of intravenous contrast.  Contrast: 50mL OMNIPAQUE IOHEXOL 300 MG/ML  SOLN, OMNIPAQUE IOHEXOL 300 MG/ML  SOLN  Comparison: CT abdomen and pelvis 05/15/2010.  Findings:  The lung bases are clear.  No pleural or pericardial effusion.  The gallbladder, liver, spleen, adrenal glands, pancreas and kidneys appear normal.  The appendix is well visualized and normal in appearance.  There is some stool in the distal ileum consistent with slow transit.  The small bowel is otherwise unremarkable.  The patient has a fairly prominent volume of stool in the ascending colon.  The colon is otherwise unremarkable.  Uterus, adnexa and urinary bladder appear normal. Small sclerotic lesion in the right femoral head is compatible with a bone island.  IMPRESSION: Negative for appendicitis.  No acute finding.   Original Report Authenticated By: Holley Dexter, M.D.    MDM   1. Abdominal pain   2. UTI (lower urinary tract infection)    No acute process noted on ct:pt is comfortable at this time:pt is tolerating po here:will treat urine as positive for nitrite:urine sent for culture   Teressa Lower, NP 07/01/13 1608

## 2013-07-01 NOTE — ED Notes (Signed)
Pt. Reports she has been taking the urostat for UTI symptoms and she also reports she could poss. Be pregnant

## 2013-07-01 NOTE — ED Provider Notes (Signed)
Medical screening examination/treatment/procedure(s) were performed by non-physician practitioner and as supervising physician I was immediately available for consultation/collaboration.   Megan E Docherty, MD 07/01/13 2106 

## 2013-07-01 NOTE — ED Notes (Signed)
Pt. Reports nausea and repeats multiple times. "I feel really really bad".  Pt. Reports she has back pain and abd. Pain with the feeling of needing to "pee" every 5 mins.

## 2013-07-02 LAB — URINE CULTURE: Colony Count: NO GROWTH

## 2013-10-27 ENCOUNTER — Encounter (HOSPITAL_BASED_OUTPATIENT_CLINIC_OR_DEPARTMENT_OTHER): Payer: Self-pay | Admitting: Emergency Medicine

## 2013-10-27 ENCOUNTER — Emergency Department (HOSPITAL_BASED_OUTPATIENT_CLINIC_OR_DEPARTMENT_OTHER): Payer: BC Managed Care – PPO

## 2013-10-27 ENCOUNTER — Emergency Department (HOSPITAL_BASED_OUTPATIENT_CLINIC_OR_DEPARTMENT_OTHER)
Admission: EM | Admit: 2013-10-27 | Discharge: 2013-10-27 | Disposition: A | Discharge status: Home / Self Care | Payer: BC Managed Care – PPO | Attending: Emergency Medicine | Admitting: Emergency Medicine

## 2013-10-27 DIAGNOSIS — Z79899 Other long term (current) drug therapy: Secondary | ICD-10-CM | POA: Insufficient documentation

## 2013-10-27 DIAGNOSIS — Z792 Long term (current) use of antibiotics: Secondary | ICD-10-CM | POA: Insufficient documentation

## 2013-10-27 DIAGNOSIS — IMO0001 Reserved for inherently not codable concepts without codable children: Secondary | ICD-10-CM | POA: Insufficient documentation

## 2013-10-27 DIAGNOSIS — M25519 Pain in unspecified shoulder: Secondary | ICD-10-CM | POA: Insufficient documentation

## 2013-10-27 DIAGNOSIS — G8929 Other chronic pain: Secondary | ICD-10-CM | POA: Insufficient documentation

## 2013-10-27 DIAGNOSIS — Z872 Personal history of diseases of the skin and subcutaneous tissue: Secondary | ICD-10-CM | POA: Insufficient documentation

## 2013-10-27 DIAGNOSIS — Z87891 Personal history of nicotine dependence: Secondary | ICD-10-CM | POA: Insufficient documentation

## 2013-10-27 DIAGNOSIS — K5900 Constipation, unspecified: Secondary | ICD-10-CM | POA: Insufficient documentation

## 2013-10-27 HISTORY — DX: Reserved for concepts with insufficient information to code with codable children: IMO0002

## 2013-10-27 LAB — CBC WITH DIFFERENTIAL/PLATELET
Basophils Relative: 0 % (ref 0–1)
HCT: 41.3 % (ref 36.0–46.0)
Hemoglobin: 14.1 g/dL (ref 12.0–15.0)
Lymphs Abs: 3.1 10*3/uL (ref 0.7–4.0)
MCH: 29.4 pg (ref 26.0–34.0)
MCHC: 34.1 g/dL (ref 30.0–36.0)
Monocytes Absolute: 0.7 10*3/uL (ref 0.1–1.0)
Monocytes Relative: 9 % (ref 3–12)
Neutro Abs: 3.7 10*3/uL (ref 1.7–7.7)
Neutrophils Relative %: 48 % (ref 43–77)
RBC: 4.8 MIL/uL (ref 3.87–5.11)

## 2013-10-27 LAB — COMPREHENSIVE METABOLIC PANEL
Albumin: 3.9 g/dL (ref 3.5–5.2)
Alkaline Phosphatase: 86 U/L (ref 39–117)
BUN: 11 mg/dL (ref 6–23)
Chloride: 101 mEq/L (ref 96–112)
Creatinine, Ser: 1.1 mg/dL (ref 0.50–1.10)
GFR calc Af Amer: 73 mL/min — ABNORMAL LOW (ref >90)
GFR calc non Af Amer: 63 mL/min — ABNORMAL LOW (ref >90)
Glucose, Bld: 125 mg/dL — ABNORMAL HIGH (ref 70–99)
Potassium: 3.5 mEq/L (ref 3.5–5.1)
Total Bilirubin: 0.2 mg/dL — ABNORMAL LOW (ref 0.3–1.2)

## 2013-10-27 MED ORDER — ONDANSETRON 8 MG PO TBDP: ORAL_TABLET | ORAL | Status: DC

## 2013-10-27 MED ORDER — SODIUM CHLORIDE 0.9 % IV SOLN
Freq: Once | INTRAVENOUS | Status: AC
  Administered 2013-10-27: 1000 mL via INTRAVENOUS

## 2013-10-27 NOTE — ED Provider Notes (Signed)
CSN: [id number]     Arrival date & time 10/27/13  0345 History   First MD Initiated Contact with Patient 10/27/13 0426     No chief complaint on file.  (Consider location/radiation/quality/duration/timing/severity/associated sxs/prior Treatment) HPI Comments: Patient is a 37 year old female with history of fibromyalgia and chronic shoulder pain on routine narcotic pain medication. She presents today with complaints of not having a bowel movement in the past 10 days. She states she has had tried multiple over-the-counter remedies however nothing has helped. She denies any fevers or chills. She denies any vomiting.  Patient is a 37 y.o. female presenting with constipation. The history is provided by the patient.  Constipation Severity:  Moderate Time since last bowel movement:  10 days Timing:  Constant Progression:  Worsening Chronicity:  New Context: dehydration, medication and narcotics   Stool description:  Hard Relieved by:  Nothing Worsened by:  Nothing tried Ineffective treatments:  None tried Associated symptoms: abdominal pain     Past Medical History  Diagnosis Date  . Fibromyalgia   . Ulcer    Past Surgical History  Procedure Laterality Date  . Nose surgery     No family history on file. History  Substance Use Topics  . Smoking status: Former Games developer  . Smokeless tobacco: Not on file  . Alcohol Use: No   OB History   Grav Para Term Preterm Abortions TAB SAB Ect Mult Living                 Review of Systems  Gastrointestinal: Positive for abdominal pain and constipation.  All other systems reviewed and are negative.    Allergies  Shellfish allergy  Home Medications   Current Outpatient Rx  Name  Route  Sig  Dispense  Refill  . esomeprazole (NEXIUM) 20 MG capsule   Oral   Take 20 mg by mouth daily at 12 noon.         Marland Kitchen acyclovir (ZOVIRAX) 200 MG capsule   Oral   Take 200 mg by mouth 2 (two) times daily.         Marland Kitchen alprazolam (XANAX) 2 MG  tablet   Oral   Take 2 mg by mouth every morning.         . carisoprodol-aspirin (SOMA COMPOUND) 200-325 MG per tablet   Oral   Take 1 tablet by mouth 4 (four) times daily as needed for muscle spasms.         . nitrofurantoin, macrocrystal-monohydrate, (MACROBID) 100 MG capsule   Oral   Take 1 capsule (100 mg total) by mouth 2 (two) times daily.   14 capsule   0   . oxyCODONE (OXYCONTIN) 10 MG 12 hr tablet   Oral   Take 10 mg by mouth every 12 (twelve) hours.          BP 130/88  Pulse 116  Temp(Src) 98.2 F (36.8 C) (Oral)  Resp 20  Ht 5\' 7"  (1.702 m)  Wt 220 lb (99.791 kg)  BMI 34.45 kg/m2  SpO2 96% Physical Exam  Nursing note and vitals reviewed. Constitutional: She is oriented to person, place, and time. She appears well-developed and well-nourished. No distress.  HENT:  Head: Normocephalic and atraumatic.  Neck: Normal range of motion. Neck supple.  Cardiovascular: Normal rate and regular rhythm.  Exam reveals no gallop and no friction rub.   No murmur heard. Pulmonary/Chest: Effort normal and breath sounds normal. No respiratory distress. She has no wheezes.  Abdominal: Soft. Bowel sounds  are normal. She exhibits no distension. There is no tenderness.  Musculoskeletal: Normal range of motion.  Neurological: She is alert and oriented to person, place, and time.  Skin: Skin is warm and dry. She is not diaphoretic.    ED Course  Procedures (including critical care time) Labs Review Labs Reviewed  CBC WITH DIFFERENTIAL  COMPREHENSIVE METABOLIC PANEL   Imaging Review No results found.    MDM  No diagnosis found. Patient is a 37 year old female on chronic narcotic medication for fibromyalgia. She presents today with complaints of no bowel movement for the past week and a half. Her laboratory studies are unremarkable, however x-ray reveals significant amount of retained stool. There is no stool noted within the rectum that would be amenable to  disimpaction. I will recommend magnesium citrate. She will be discharged to home is instructed to return or followup with her Dr. if symptoms do not improve.    Geoffery Lyons, MD 10/27/13 [phone]

## 2013-10-27 NOTE — ED Notes (Signed)
No bowel movement in past 1.5 weeks,  otc meds not helping

## 2013-11-23 ENCOUNTER — Emergency Department (HOSPITAL_BASED_OUTPATIENT_CLINIC_OR_DEPARTMENT_OTHER)
Admission: EM | Admit: 2013-11-23 | Discharge: 2013-11-23 | Disposition: A | Discharge status: Home / Self Care | Payer: BC Managed Care – PPO | Attending: Emergency Medicine | Admitting: Emergency Medicine

## 2013-11-23 ENCOUNTER — Encounter (HOSPITAL_BASED_OUTPATIENT_CLINIC_OR_DEPARTMENT_OTHER): Payer: Self-pay | Admitting: Emergency Medicine

## 2013-11-23 DIAGNOSIS — Z872 Personal history of diseases of the skin and subcutaneous tissue: Secondary | ICD-10-CM | POA: Insufficient documentation

## 2013-11-23 DIAGNOSIS — Z3202 Encounter for pregnancy test, result negative: Secondary | ICD-10-CM | POA: Insufficient documentation

## 2013-11-23 DIAGNOSIS — R350 Frequency of micturition: Secondary | ICD-10-CM | POA: Insufficient documentation

## 2013-11-23 DIAGNOSIS — Z8739 Personal history of other diseases of the musculoskeletal system and connective tissue: Secondary | ICD-10-CM | POA: Insufficient documentation

## 2013-11-23 DIAGNOSIS — R300 Dysuria: Secondary | ICD-10-CM

## 2013-11-23 DIAGNOSIS — Z87891 Personal history of nicotine dependence: Secondary | ICD-10-CM | POA: Insufficient documentation

## 2013-11-23 DIAGNOSIS — Z79899 Other long term (current) drug therapy: Secondary | ICD-10-CM | POA: Insufficient documentation

## 2013-11-23 LAB — URINALYSIS, ROUTINE W REFLEX MICROSCOPIC
Glucose, UA: NEGATIVE mg/dL
HGB URINE DIPSTICK: NEGATIVE
Ketones, ur: NEGATIVE mg/dL
Leukocytes, UA: NEGATIVE
Nitrite: POSITIVE — AB
PH: 5.5 (ref 5.0–8.0)
Protein, ur: NEGATIVE mg/dL
SPECIFIC GRAVITY, URINE: 1.04 — AB (ref 1.005–1.030)
Urobilinogen, UA: 1 mg/dL (ref 0.0–1.0)

## 2013-11-23 LAB — URINE MICROSCOPIC-ADD ON

## 2013-11-23 LAB — PREGNANCY, URINE: Preg Test, Ur: NEGATIVE

## 2013-11-23 MED ORDER — OXYCODONE-ACETAMINOPHEN 5-325 MG PO TABS
2.0000 | ORAL_TABLET | Freq: Once | ORAL | Status: AC
  Administered 2013-11-23: 2 via ORAL
  Filled 2013-11-23: qty 2

## 2013-11-23 MED ORDER — FLUCONAZOLE 150 MG PO TABS: 200.0000 mg | ORAL_TABLET | Freq: Every day | ORAL | Status: AC

## 2013-11-23 MED ORDER — CIPROFLOXACIN HCL 500 MG PO TABS: 500.0000 mg | ORAL_TABLET | Freq: Two times a day (BID) | ORAL | Status: DC

## 2013-11-23 MED ORDER — OXYCODONE-ACETAMINOPHEN 5-325 MG PO TABS: 2.0000 | ORAL_TABLET | ORAL | Status: DC | PRN

## 2013-11-23 NOTE — Discharge Instructions (Signed)

## 2013-11-23 NOTE — ED Notes (Signed)
Dysuria. States she locked her pain medication in a safe and lost the key. She talked to her pain clinic and we are allowed to give her more pain medication for her fibromyalgia per pt.

## 2013-11-23 NOTE — ED Notes (Signed)
Pt spouse assumed care on d/c.

## 2013-11-23 NOTE — ED Notes (Signed)
Pt's husband arrived to ED and in the room.  Pt medicated as ordered.

## 2013-11-23 NOTE — ED Notes (Signed)
Pt states she goes to a pain clinic.  Pt states she needs pain medication.

## 2013-11-23 NOTE — ED Notes (Signed)
Pt observed by myself, registration, and Kathlene NovemberMike, Security driving a black SUV off ED property.

## 2013-11-23 NOTE — ED Provider Notes (Signed)
CSN: [id number]     Arrival date & time 11/23/13  1252 History   First MD Initiated Contact with Patient 11/23/13 1300     Chief Complaint  Patient presents with  . Dysuria   (Consider location/radiation/quality/duration/timing/severity/associated sxs/prior Treatment) Patient is a 38 y.o. female presenting with dysuria. The history is provided by the patient. No language interpreter was used.  Dysuria Pain quality:  Aching and burning Pain severity:  Severe Timing:  Constant Progression:  Worsening Chronicity:  New Relieved by:  Nothing Worsened by:  Nothing tried Urinary symptoms: frequent urination   Pt reports she has cystitis.  Pt is between urologist.   Pt is also out of pain medications.  Pt reports the pain clinic wants [place] to work with them and give her pain medications.    Past Medical History  Diagnosis Date  . Fibromyalgia   . Ulcer    Past Surgical History  Procedure Laterality Date  . Nose surgery     No family history on file. History  Substance Use Topics  . Smoking status: Former Games developer  . Smokeless tobacco: Not on file  . Alcohol Use: No   OB History   Grav Para Term Preterm Abortions TAB SAB Ect Mult Living                 Review of Systems  Genitourinary: Positive for dysuria.  All other systems reviewed and are negative.    Allergies  Shellfish allergy  Home Medications   Current Outpatient Rx  Name  Route  Sig  Dispense  Refill  . acyclovir (ZOVIRAX) 200 MG capsule   Oral   Take 200 mg by mouth 2 (two) times daily.         Marland Kitchen alprazolam (XANAX) 2 MG tablet   Oral   Take 2 mg by mouth every morning.         . carisoprodol-aspirin (SOMA COMPOUND) 200-325 MG per tablet   Oral   Take 1 tablet by mouth 4 (four) times daily as needed for muscle spasms.         Marland Kitchen esomeprazole (NEXIUM) 20 MG capsule   Oral   Take 20 mg by mouth daily at 12 noon.         . nitrofurantoin, macrocrystal-monohydrate, (MACROBID) 100 MG capsule  Oral   Take 1 capsule (100 mg total) by mouth 2 (two) times daily.   14 capsule   0   . ondansetron (ZOFRAN ODT) 8 MG disintegrating tablet      8mg  ODT q4 hours prn nausea   8 tablet   0   . oxyCODONE (OXYCONTIN) 10 MG 12 hr tablet   Oral   Take 10 mg by mouth every 12 (twelve) hours.          BP 120/76  Pulse 85  Temp(Src) 98.2 F (36.8 C) (Oral)  Resp 20  Ht 5\' 7"  (1.702 m)  Wt 220 lb (99.791 kg)  BMI 34.45 kg/m2  SpO2 96%  LMP 10/15/2013 Physical Exam  Nursing note and vitals reviewed. Constitutional: She is oriented to person, place, and time. She appears well-developed and well-nourished.  HENT:  Head: Normocephalic.  Neck: Normal range of motion.  Cardiovascular: Normal rate and normal heart sounds.   Pulmonary/Chest: Effort normal.  Abdominal: Soft.  Musculoskeletal: Normal range of motion.  Neurological: She is alert and oriented to person, place, and time. She has normal reflexes.  Skin: Skin is warm.  Psychiatric: She has a normal mood  and affect.    ED Course  Procedures (including critical care time) Labs Review Labs Reviewed  URINALYSIS, ROUTINE W REFLEX MICROSCOPIC - Abnormal; Notable for the following:    Color, Urine ORANGE (*)    Specific Gravity, Urine 1.040 (*)    Bilirubin Urine SMALL (*)    Nitrite POSITIVE (*)    All other components within normal limits  URINE MICROSCOPIC-ADD ON - Abnormal; Notable for the following:    Bacteria, UA MANY (*)    All other components within normal limits  PREGNANCY, URINE   Imaging Review No results found.  EKG Interpretation   None       MDM   1. Dysuria    Pt is nitrate positive.  I will treat with cipro and culture urine.   Pt given 2 percocet here.  Pt given rx for 12 percocet.  Pt needs to speak to her care provider/pain mangement regarding her medications/  Contact a locksmith   Elson AreasLeslie K Sofia, PA-C 11/23/13 [address]1418  Leslie K CokevilleSofia, [place] JerseyPA-C 11/23/13 1418

## 2013-11-23 NOTE — ED Notes (Signed)
Pt instructed that she would need a ride home before pain medication is given.  Pt had told me earlier that her husband was coming but now she states he is not.  She states that her mother is coming but would be a while before getting here.  Pt instructed that when she gets here, we can give her pain medication.  Pt then states she will take a cab.  Again, informed pt that we would need to see the cab driver here before giving pain medication.

## 2013-12-01 NOTE — ED Provider Notes (Signed)
Medical screening examination/treatment/procedure(s) were performed by non-physician practitioner and as supervising physician I was immediately available for consultation/collaboration.  EKG Interpretation   None        Gilda Creasehristopher J. Hawa Henly, MD 12/01/13 [phone]

## 2014-03-23 ENCOUNTER — Emergency Department (HOSPITAL_BASED_OUTPATIENT_CLINIC_OR_DEPARTMENT_OTHER)
Admission: EM | Admit: 2014-03-23 | Discharge: 2014-03-23 | Disposition: A | Discharge status: Home / Self Care | Payer: BC Managed Care – PPO | Attending: Emergency Medicine | Admitting: Emergency Medicine

## 2014-03-23 ENCOUNTER — Encounter (HOSPITAL_BASED_OUTPATIENT_CLINIC_OR_DEPARTMENT_OTHER): Payer: Self-pay | Admitting: Emergency Medicine

## 2014-03-23 DIAGNOSIS — Z792 Long term (current) use of antibiotics: Secondary | ICD-10-CM | POA: Insufficient documentation

## 2014-03-23 DIAGNOSIS — R5383 Other fatigue: Secondary | ICD-10-CM

## 2014-03-23 DIAGNOSIS — Y9389 Activity, other specified: Secondary | ICD-10-CM | POA: Insufficient documentation

## 2014-03-23 DIAGNOSIS — R5381 Other malaise: Secondary | ICD-10-CM

## 2014-03-23 DIAGNOSIS — Y9289 Other specified places as the place of occurrence of the external cause: Secondary | ICD-10-CM | POA: Insufficient documentation

## 2014-03-23 DIAGNOSIS — R21 Rash and other nonspecific skin eruption: Secondary | ICD-10-CM

## 2014-03-23 DIAGNOSIS — R110 Nausea: Secondary | ICD-10-CM

## 2014-03-23 DIAGNOSIS — L98499 Non-pressure chronic ulcer of skin of other sites with unspecified severity: Secondary | ICD-10-CM | POA: Insufficient documentation

## 2014-03-23 DIAGNOSIS — W57XXXA Bitten or stung by nonvenomous insect and other nonvenomous arthropods, initial encounter: Secondary | ICD-10-CM

## 2014-03-23 DIAGNOSIS — Z79899 Other long term (current) drug therapy: Secondary | ICD-10-CM | POA: Insufficient documentation

## 2014-03-23 DIAGNOSIS — Z87448 Personal history of other diseases of urinary system: Secondary | ICD-10-CM | POA: Insufficient documentation

## 2014-03-23 DIAGNOSIS — Z87891 Personal history of nicotine dependence: Secondary | ICD-10-CM | POA: Insufficient documentation

## 2014-03-23 LAB — URINALYSIS, ROUTINE W REFLEX MICROSCOPIC
BILIRUBIN URINE: NEGATIVE
Glucose, UA: NEGATIVE mg/dL
Hgb urine dipstick: NEGATIVE
KETONES UR: NEGATIVE mg/dL
Leukocytes, UA: NEGATIVE
NITRITE: NEGATIVE
PH: 5.5 (ref 5.0–8.0)
Protein, ur: NEGATIVE mg/dL
Specific Gravity, Urine: 1.009 (ref 1.005–1.030)
UROBILINOGEN UA: 0.2 mg/dL (ref 0.0–1.0)

## 2014-03-23 MED ORDER — ONDANSETRON 4 MG PO TBDP
4.0000 mg | ORAL_TABLET | Freq: Once | ORAL | Status: AC
Start: 2014-03-23 — End: 2014-03-23
  Administered 2014-03-23: 4 mg via ORAL
  Filled 2014-03-23: qty 1

## 2014-03-23 MED ORDER — DOXYCYCLINE HYCLATE 100 MG PO CAPS: 100.0000 mg | ORAL_CAPSULE | Freq: Two times a day (BID) | ORAL | Status: DC

## 2014-03-23 MED ORDER — ONDANSETRON HCL 4 MG PO TABS: 4.0000 mg | ORAL_TABLET | Freq: Four times a day (QID) | ORAL | Status: DC

## 2014-03-23 NOTE — ED Notes (Signed)
Pt c/o tick removal on sat and today fatigue and generalized weakness and rash

## 2014-03-23 NOTE — Discharge Instructions (Signed)
Tick Bite Information Ticks are insects that attach themselves to the skin and draw blood for food. There are various types of ticks. Common types include wood ticks and deer ticks. Most ticks live in shrubs and grassy areas. Ticks can climb onto your body when you make contact with leaves or grass where the tick is waiting. The most common places on the body for ticks to attach themselves are the scalp, neck, armpits, waist, and groin. Most tick bites are harmless, but sometimes ticks carry germs that cause diseases. These germs can be spread to a person during the tick's feeding process. The chance of a disease spreading through a tick bite depends on:   The type of tick.  Time of year.   How long the tick is attached.   Geographic location.  HOW CAN YOU PREVENT TICK BITES? Take these steps to help prevent tick bites when you are outdoors:  Wear protective clothing. Long sleeves and long pants are best.   Wear white clothes so you can see ticks more easily.  Tuck your pant legs into your socks.   If walking on a trail, stay in the middle of the trail to avoid brushing against bushes.  Avoid walking through areas with long grass.  Put insect repellent on all exposed skin and along boot tops, pant legs, and sleeve cuffs.   Check clothing, hair, and skin repeatedly and before going inside.   Brush off any ticks that are not attached.  Take a shower or bath as soon as possible after being outdoors.  WHAT IS THE PROPER WAY TO REMOVE A TICK? Ticks should be removed as soon as possible to help prevent diseases caused by tick bites. 1. If latex gloves are available, put them on before trying to remove a tick.  2. Using fine-point tweezers, grasp the tick as close to the skin as possible. You may also use curved forceps or a tick removal tool. Grasp the tick as close to its head as possible. Avoid grasping the tick on its body. 3. Pull gently with steady upward pressure until  the tick lets go. Do not twist the tick or jerk it suddenly. This may break off the tick's head or mouth parts. 4. Do not squeeze or crush the tick's body. This could force disease-carrying fluids from the tick into your body.  5. After the tick is removed, wash the bite area and your hands with soap and water or other disinfectant such as alcohol. 6. Apply a small amount of antiseptic cream or ointment to the bite site.  7. Wash and disinfect any instruments that were used.  Do not try to remove a tick by applying a hot match, petroleum jelly, or fingernail polish to the tick. These methods do not work and may increase the chances of disease being spread from the tick bite.  WHEN SHOULD YOU SEEK MEDICAL CARE? Contact your health care provider if you are unable to remove a tick from your skin or if a part of the tick breaks off and is stuck in the skin.  After a tick bite, you need to be aware of signs and symptoms that could be related to diseases spread by ticks. Contact your health care provider if you develop any of the following in the days or weeks after the tick bite:  Unexplained fever.  Rash. A circular rash that appears days or weeks after the tick bite may indicate the possibility of Lyme disease. The rash may resemble   a target with a bull's-eye and may occur at a different part of your body than the tick bite.  Redness and swelling in the area of the tick bite.   Tender, swollen lymph glands.   Diarrhea.   Weight loss.   Cough.   Fatigue.   Muscle, joint, or bone pain.   Abdominal pain.   Headache.   Lethargy or a change in your level of consciousness.  Difficulty walking or moving your legs.   Numbness in the legs.   Paralysis.  Shortness of breath.   Confusion.   Repeated vomiting.  Document Released: 10/18/2000 Document Revised: 08/11/2013 Document Reviewed: 03/31/2013 [employer] Patient Information 2014 [employer].  

## 2014-03-23 NOTE — ED Provider Notes (Signed)
CSN: [id number]     Arrival date & time 03/23/14  1529 History   First MD Initiated Contact with Patient 03/23/14 1544     Chief Complaint  Patient presents with  . Insect Bite     (Consider location/radiation/quality/duration/timing/severity/associated sxs/prior Treatment) Patient is a 38 y.o. female presenting with general illness.  Illness Quality:  Nausea, rash Severity:  Moderate Onset quality:  Gradual Duration:  2 days Timing:  Constant Progression:  Unchanged Chronicity:  New Context:  Pulled a tick off neck 5 days ago Relieved by:  Nothing Ineffective treatments:  OTC nausea medicine Associated symptoms: myalgias, nausea and rash (left forearm)   Associated symptoms: no abdominal pain, no chest pain, no diarrhea, no fever and no shortness of breath     Past Medical History  Diagnosis Date  . Fibromyalgia   . Ulcer    Past Surgical History  Procedure Laterality Date  . Nose surgery     History reviewed. No pertinent family history. History  Substance Use Topics  . Smoking status: Former Games developermoker  . Smokeless tobacco: Not on file  . Alcohol Use: No   OB History   Grav Para Term Preterm Abortions TAB SAB Ect Mult Living                 Review of Systems  Constitutional: Negative for fever.  Respiratory: Negative for shortness of breath.   Cardiovascular: Negative for chest pain.  Gastrointestinal: Positive for nausea. Negative for abdominal pain and diarrhea.  Musculoskeletal: Positive for myalgias.  Skin: Positive for rash (left forearm).  All other systems reviewed and are negative.     Allergies  Shellfish allergy  Home Medications   Prior to Admission medications   Medication Sig Start Date End Date Taking? Authorizing Provider  acyclovir (ZOVIRAX) 200 MG capsule Take 200 mg by mouth 2 (two) times daily.    Historical Provider, MD  alprazolam Prudy Feeler(XANAX) 2 MG tablet Take 2 mg by mouth every morning.    Historical Provider, MD   carisoprodol-aspirin (SOMA COMPOUND) 200-325 MG per tablet Take 1 tablet by mouth 4 (four) times daily as needed for muscle spasms.    Historical Provider, MD  ciprofloxacin (CIPRO) 500 MG tablet Take 1 tablet (500 mg total) by mouth 2 (two) times daily. 11/23/13   Elson AreasLeslie K Sofia, PA-C  esomeprazole (NEXIUM) 20 MG capsule Take 20 mg by mouth daily at 12 noon.    Historical Provider, MD  nitrofurantoin, macrocrystal-monohydrate, (MACROBID) 100 MG capsule Take 1 capsule (100 mg total) by mouth 2 (two) times daily. 07/01/13   Teressa LowerVrinda Pickering, NP  ondansetron (ZOFRAN ODT) 8 MG disintegrating tablet 8mg  ODT q4 hours prn nausea 10/27/13   Geoffery Lyonsouglas Delo, MD  oxyCODONE (OXYCONTIN) 10 MG 12 hr tablet Take 10 mg by mouth every 12 (twelve) hours.    Historical Provider, MD  oxyCODONE-acetaminophen (PERCOCET/ROXICET) 5-325 MG per tablet Take 2 tablets by mouth every 4 (four) hours as needed for severe pain. 11/23/13   Elson AreasLeslie K Sofia, PA-C   BP 133/92  Pulse 72  Temp(Src) 99 F (37.2 C) (Oral)  Resp 16  Ht 5\' 7"  (1.702 m)  Wt 200 lb (90.719 kg)  BMI 31.32 kg/m2  SpO2 99%  LMP 03/09/2014 Physical Exam  Nursing note and vitals reviewed. Constitutional: She is oriented to person, place, and time. She appears well-developed and well-nourished. No distress.  HENT:  Head: Normocephalic and atraumatic.  Mouth/Throat: Oropharynx is clear and moist.  Eyes: Conjunctivae are normal. Pupils  are equal, round, and reactive to light. No scleral icterus.  Neck: Neck supple.  Cardiovascular: Normal rate, regular rhythm, normal heart sounds and intact distal pulses.   No murmur heard. Pulmonary/Chest: Effort normal and breath sounds normal. No stridor. No respiratory distress. She has no rales.  Abdominal: Soft. Bowel sounds are normal. She exhibits no distension. There is no tenderness.  Musculoskeletal: Normal range of motion.  Neurological: She is alert and oriented to person, place, and time.  Skin: Skin is warm  and dry. Rash noted. No petechiae and no purpura noted. Rash is papular (left forearm). Rash is not urticarial.  Psychiatric: She has a normal mood and affect. Her behavior is normal.    ED Course  Procedures (including critical care time) Labs Review Labs Reviewed  URINALYSIS, ROUTINE W REFLEX MICROSCOPIC - Abnormal; Notable for the following:    APPearance CLOUDY (*)    All other components within normal limits    Imaging Review No results found.   EKG Interpretation None      MDM   Final diagnoses:  Tick bite  Nausea  Malaise  Rash    38 year old female presenting with malaise, arthralgias, nausea, and rash. 5 days ago she pulled a tick off her neck. Rash appears more consistent with contact dermatitis and RMSF. However, with known tick exposure of unknown duration and nonspecific symptoms, wil treat with doxycycline. Will also check UA. Otherwise, symptoms could represent mild viral syndrome.   UA negative. Discharged with doxycycline.  Merrie RoofJohn David Carlin Mamone III, MD 03/23/14 1758

## 2014-06-21 DIAGNOSIS — N3010 Interstitial cystitis (chronic) without hematuria: Secondary | ICD-10-CM | POA: Insufficient documentation

## 2014-09-23 ENCOUNTER — Emergency Department (HOSPITAL_BASED_OUTPATIENT_CLINIC_OR_DEPARTMENT_OTHER)
Admission: EM | Admit: 2014-09-23 | Discharge: 2014-09-23 | Disposition: A | Discharge status: Home / Self Care | Payer: BC Managed Care – PPO | Attending: Emergency Medicine | Admitting: Emergency Medicine

## 2014-09-23 ENCOUNTER — Encounter (HOSPITAL_BASED_OUTPATIENT_CLINIC_OR_DEPARTMENT_OTHER): Payer: Self-pay | Admitting: Emergency Medicine

## 2014-09-23 DIAGNOSIS — Z792 Long term (current) use of antibiotics: Secondary | ICD-10-CM | POA: Diagnosis not present

## 2014-09-23 DIAGNOSIS — M1990 Unspecified osteoarthritis, unspecified site: Secondary | ICD-10-CM | POA: Diagnosis not present

## 2014-09-23 DIAGNOSIS — Z79899 Other long term (current) drug therapy: Secondary | ICD-10-CM | POA: Insufficient documentation

## 2014-09-23 DIAGNOSIS — G8929 Other chronic pain: Secondary | ICD-10-CM | POA: Diagnosis not present

## 2014-09-23 DIAGNOSIS — Z872 Personal history of diseases of the skin and subcutaneous tissue: Secondary | ICD-10-CM | POA: Diagnosis not present

## 2014-09-23 DIAGNOSIS — Z8659 Personal history of other mental and behavioral disorders: Secondary | ICD-10-CM | POA: Insufficient documentation

## 2014-09-23 DIAGNOSIS — Z87891 Personal history of nicotine dependence: Secondary | ICD-10-CM | POA: Insufficient documentation

## 2014-09-23 DIAGNOSIS — R21 Rash and other nonspecific skin eruption: Secondary | ICD-10-CM | POA: Diagnosis present

## 2014-09-23 HISTORY — DX: Other chronic pain: G89.29

## 2014-09-23 HISTORY — DX: Anxiety disorder, unspecified: F41.9

## 2014-09-23 HISTORY — DX: Unspecified osteoarthritis, unspecified site: M19.90

## 2014-09-23 HISTORY — DX: Interstitial cystitis (chronic) without hematuria: N30.10

## 2014-09-23 HISTORY — DX: Other chronic pain: M25.569

## 2014-09-23 MED ORDER — FLUCONAZOLE 150 MG PO TABS: 150.0000 mg | ORAL_TABLET | Freq: Once | ORAL | Status: DC

## 2014-09-23 MED ORDER — DOXYCYCLINE HYCLATE 100 MG PO CAPS: 100.0000 mg | ORAL_CAPSULE | Freq: Two times a day (BID) | ORAL | Status: DC

## 2014-09-23 NOTE — Discharge Instructions (Signed)

## 2014-09-23 NOTE — ED Notes (Signed)
Pt states bug bites to right forearm, left side neck and lip area. States they are bug bites.

## 2014-09-23 NOTE — ED Provider Notes (Signed)
CSN: [id number]     Arrival date & time 09/23/14  1226 History   First MD Initiated Contact with Patient 09/23/14 1332     Chief Complaint  Patient presents with  . Rash    pt states bug bite     (Consider location/radiation/quality/duration/timing/severity/associated sxs/prior Treatment) HPI   38 year old female with history of fibromyalgia, anxiety, chronic pain presents for evaluation of a rash. Patient reports noticing several different bump that has been appearing throughout her body for the past month. She first noticed a bump on her left side of neck follows in the corner of her right leg, and down to her chin. Recently she also notice another bumps on her forearm specifically her right forearm. These bumps are painful, sometimes formed blisters and oozing out fluid. It itches occasionally. She has tried using concealer. Her friend recommend her to come to the ER for further evaluation of a rash. She also mentioned that she had a herpes rash to her lower back that she is currently taking acyclovir with improvement. She denies any rash in her mouth or in the vaginal region. She however endorse having headache, nausea which has been waxing waning for the past month as well. She is currently undergoing a divorce and has been stressing out. She denies any other environmental changes, no new change in medication, soap or detergent. She denies any recent insect bite or tick exposure but did states that she had tick bite several months ago. No complaints of chest pain, throat swelling, shortness of breath, abdominal cramping.  Past Medical History  Diagnosis Date  . Fibromyalgia   . Ulcer   . IC (interstitial cystitis)   . Anxiety   . Arthritis   . Knee pain, chronic    Past Surgical History  Procedure Laterality Date  . Nose surgery    . Cervical biopsy  w/ loop electrode excision    . Nasal septum surgery     No family history on file. History  Substance Use Topics  . Smoking  status: Former Games developermoker  . Smokeless tobacco: Not on file  . Alcohol Use: No   OB History    No data available     Review of Systems  All other systems reviewed and are negative.     Allergies  Shellfish allergy  Home Medications   Prior to Admission medications   Medication Sig Start Date End Date Taking? Authorizing Provider  carisoprodol (SOMA) 350 MG tablet Take 350 mg by mouth 4 (four) times daily.   Yes Historical Provider, MD  clonazePAM (KLONOPIN) 0.5 MG tablet Take 0.5 mg by mouth 3 (three) times daily as needed for anxiety.   Yes Historical Provider, MD  co-enzyme Q-10 30 MG capsule Take 100 mg by mouth 3 (three) times daily.   Yes Historical Provider, MD  gabapentin (NEURONTIN) 400 MG capsule Take 600 mg by mouth 3 (three) times daily.   Yes Historical Provider, MD  lidocaine (LIDODERM) 5 % Place 1 patch onto the skin daily. Remove & Discard patch within 12 hours or as directed by MD   Yes Historical Provider, MD  magnesium gluconate (MAGONATE) 500 MG tablet Take 250 mg by mouth 2 (two) times daily.   Yes Historical Provider, MD  methadone (DOLOPHINE) 5 MG tablet Take 15 mg by mouth every 8 (eight) hours as needed.   Yes Historical Provider, MD  Multiple Vitamin (MULTIVITAMIN WITH MINERALS) TABS tablet Take 1 tablet by mouth daily.   Yes Historical Provider, MD  pentosan polysulfate (ELMIRON) 100 MG capsule Take 200 mg by mouth 3 (three) times daily.   Yes Historical Provider, MD  sertraline (ZOLOFT) 100 MG tablet Take 150 mg by mouth daily.   Yes Historical Provider, MD  vitamin A 7500 UNIT capsule Take 7,500 Units by mouth daily.   Yes Historical Provider, MD  vitamin B-12 (CYANOCOBALAMIN) 500 MCG tablet Take 300 mcg by mouth daily.   Yes Historical Provider, MD  Vitamin D, Ergocalciferol, (DRISDOL) 50000 UNITS CAPS capsule Take 8,000 Units by mouth every 7 (seven) days.   Yes Historical Provider, MD  acyclovir (ZOVIRAX) 200 MG capsule Take 200 mg by mouth 2 (two) times  daily.    Historical Provider, MD  alprazolam Prudy Feeler(XANAX) 2 MG tablet Take 2 mg by mouth every morning.    Historical Provider, MD  carisoprodol-aspirin (SOMA COMPOUND) 200-325 MG per tablet Take 1 tablet by mouth 4 (four) times daily as needed for muscle spasms.    Historical Provider, MD  ciprofloxacin (CIPRO) 500 MG tablet Take 1 tablet (500 mg total) by mouth 2 (two) times daily. 11/23/13   Elson AreasLeslie K Sofia, PA-C  doxycycline (VIBRAMYCIN) 100 MG capsule Take 1 capsule (100 mg total) by mouth 2 (two) times daily. 03/23/14   Warnell Foresterrey Wofford, MD  esomeprazole (NEXIUM) 20 MG capsule Take 20 mg by mouth daily at 12 noon.    Historical Provider, MD  nitrofurantoin, macrocrystal-monohydrate, (MACROBID) 100 MG capsule Take 1 capsule (100 mg total) by mouth 2 (two) times daily. 07/01/13   Teressa LowerVrinda Pickering, NP  ondansetron (ZOFRAN ODT) 8 MG disintegrating tablet 8mg  ODT q4 hours prn nausea 10/27/13   Geoffery Lyonsouglas Delo, MD  ondansetron (ZOFRAN) 4 MG tablet Take 1 tablet (4 mg total) by mouth every 6 (six) hours. 03/23/14   Warnell Foresterrey Wofford, MD  oxyCODONE (OXYCONTIN) 10 MG 12 hr tablet Take 10 mg by mouth every 12 (twelve) hours.    Historical Provider, MD  oxyCODONE-acetaminophen (PERCOCET/ROXICET) 5-325 MG per tablet Take 2 tablets by mouth every 4 (four) hours as needed for severe pain. 11/23/13   Elson AreasLeslie K Sofia, PA-C   BP 115/71 mmHg  Pulse 80  Temp(Src) 98.6 F (37 C) (Oral)  Resp 16  Wt 200 lb (90.719 kg)  SpO2 94%  LMP 09/09/2014 Physical Exam  Constitutional: She is oriented to person, place, and time. She appears well-developed and well-nourished. No distress.  HENT:  Head: Atraumatic.  Eyes: Conjunctivae are normal.  Neck: Neck supple.  No nuchal rigidity.  Neurological: She is alert and oriented to person, place, and time.  Skin: No rash (several small maculopapular lesion noted to left  side of the neck,  chin, the corner of her right lip, and her right forearm. Rash is mildly erythematous but non-pustular  and no obvious fascicular lesion.  No mucosal rash.) noted.  Psychiatric: She has a normal mood and affect.  Nursing note and vitals reviewed.   ED Course  Procedures (including critical care time)  1:52 PM Patient with rash noted to neck phase and her right forearm. Other rash does not appears to be infectious in etiology patient is concerned and mentioned that she has to find the past and now complaining of headache. I have very low suspicion for FEVER, LYME DISEASE, OR OTHER EMERGENT RASH. THERE IS NO MUCOSAL INVOLVEMENT. SHE IS AFEBRILE WITH STABLE NORMAL VITAL SIGN. Given her concern, will prescribe doxy as treatment.  Recommend eurecin cream at home. Pt currently not sexually active, report not pregnant.  Return precaution  given.  Pt has her personal dermatologist which she can f/u if no improvement.    Labs Review Labs Reviewed - No data to display  Imaging Review No results found.   EKG Interpretation None      MDM   Final diagnoses:  Rash   BP 115/71 mmHg  Pulse 80  Temp(Src) 98.6 F (37 C) (Oral)  Resp 16  Wt 200 lb (90.719 kg)  SpO2 94%  LMP 09/09/2014      Fayrene Helper, PA-C 09/23/14 1356  Rolan Bucco, MD 09/23/14 1538

## 2015-02-22 ENCOUNTER — Emergency Department (HOSPITAL_BASED_OUTPATIENT_CLINIC_OR_DEPARTMENT_OTHER)
Admission: EM | Admit: 2015-02-22 | Discharge: 2015-02-23 | Disposition: A | Discharge status: Home / Self Care | Payer: PRIVATE HEALTH INSURANCE | Attending: Emergency Medicine | Admitting: Emergency Medicine

## 2015-02-22 ENCOUNTER — Encounter (HOSPITAL_BASED_OUTPATIENT_CLINIC_OR_DEPARTMENT_OTHER): Payer: Self-pay

## 2015-02-22 DIAGNOSIS — Z8742 Personal history of other diseases of the female genital tract: Secondary | ICD-10-CM | POA: Insufficient documentation

## 2015-02-22 DIAGNOSIS — F1193 Opioid use, unspecified with withdrawal: Secondary | ICD-10-CM

## 2015-02-22 DIAGNOSIS — Z3202 Encounter for pregnancy test, result negative: Secondary | ICD-10-CM | POA: Insufficient documentation

## 2015-02-22 DIAGNOSIS — Z79899 Other long term (current) drug therapy: Secondary | ICD-10-CM | POA: Insufficient documentation

## 2015-02-22 DIAGNOSIS — Z8739 Personal history of other diseases of the musculoskeletal system and connective tissue: Secondary | ICD-10-CM | POA: Insufficient documentation

## 2015-02-22 DIAGNOSIS — F419 Anxiety disorder, unspecified: Secondary | ICD-10-CM | POA: Insufficient documentation

## 2015-02-22 DIAGNOSIS — Z87891 Personal history of nicotine dependence: Secondary | ICD-10-CM | POA: Insufficient documentation

## 2015-02-22 DIAGNOSIS — F1123 Opioid dependence with withdrawal: Secondary | ICD-10-CM | POA: Insufficient documentation

## 2015-02-22 DIAGNOSIS — G8929 Other chronic pain: Secondary | ICD-10-CM | POA: Insufficient documentation

## 2015-02-22 DIAGNOSIS — M797 Fibromyalgia: Secondary | ICD-10-CM | POA: Insufficient documentation

## 2015-02-22 LAB — CBC WITH DIFFERENTIAL/PLATELET
Basophils Absolute: 0 10*3/uL (ref 0.0–0.1)
Basophils Relative: 0 % (ref 0–1)
EOS PCT: 0 % (ref 0–5)
Eosinophils Absolute: 0 10*3/uL (ref 0.0–0.7)
HCT: 42.6 % (ref 36.0–46.0)
HEMOGLOBIN: 14.3 g/dL (ref 12.0–15.0)
LYMPHS ABS: 2.3 10*3/uL (ref 0.7–4.0)
Lymphocytes Relative: 22 % (ref 12–46)
MCH: 28.1 pg (ref 26.0–34.0)
MCHC: 33.6 g/dL (ref 30.0–36.0)
MCV: 83.7 fL (ref 78.0–100.0)
Monocytes Absolute: 0.6 10*3/uL (ref 0.1–1.0)
Monocytes Relative: 6 % (ref 3–12)
NEUTROS PCT: 72 % (ref 43–77)
Neutro Abs: 7.5 10*3/uL (ref 1.7–7.7)
Platelets: 470 10*3/uL — ABNORMAL HIGH (ref 150–400)
RBC: 5.09 MIL/uL (ref 3.87–5.11)
RDW: 13.3 % (ref 11.5–15.5)
WBC: 10.4 10*3/uL (ref 4.0–10.5)

## 2015-02-22 LAB — COMPREHENSIVE METABOLIC PANEL
ALBUMIN: 4.9 g/dL (ref 3.5–5.2)
ALT: 18 U/L (ref 0–35)
ANION GAP: 12 (ref 5–15)
AST: 29 U/L (ref 0–37)
Alkaline Phosphatase: 71 U/L (ref 39–117)
BUN: 11 mg/dL (ref 6–23)
CO2: 23 mmol/L (ref 19–32)
CREATININE: 0.91 mg/dL (ref 0.50–1.10)
Calcium: 9.7 mg/dL (ref 8.4–10.5)
Chloride: 104 mmol/L (ref 96–112)
GFR calc Af Amer: >90 mL/min (ref >90)
GFR calc non Af Amer: 78 mL/min — ABNORMAL LOW (ref >90)
Glucose, Bld: 162 mg/dL — ABNORMAL HIGH (ref 70–99)
Potassium: 3.6 mmol/L (ref 3.5–5.1)
Sodium: 139 mmol/L (ref 135–145)
TOTAL PROTEIN: 8.9 g/dL — AB (ref 6.0–8.3)
Total Bilirubin: 0.5 mg/dL (ref 0.3–1.2)

## 2015-02-22 LAB — URINALYSIS, ROUTINE W REFLEX MICROSCOPIC
BILIRUBIN URINE: NEGATIVE
Glucose, UA: NEGATIVE mg/dL
Hgb urine dipstick: NEGATIVE
KETONES UR: 15 mg/dL — AB
Leukocytes, UA: NEGATIVE
Nitrite: NEGATIVE
Protein, ur: 30 mg/dL — AB
Specific Gravity, Urine: 1.022 (ref 1.005–1.030)
UROBILINOGEN UA: 0.2 mg/dL (ref 0.0–1.0)
pH: 8 (ref 5.0–8.0)

## 2015-02-22 LAB — URINE MICROSCOPIC-ADD ON

## 2015-02-22 LAB — PREGNANCY, URINE: Preg Test, Ur: NEGATIVE

## 2015-02-22 LAB — LIPASE, BLOOD: LIPASE: 55 U/L (ref 11–59)

## 2015-02-22 MED ORDER — CLONIDINE HCL 0.1 MG PO TABS
0.2000 mg | ORAL_TABLET | Freq: Once | ORAL | Status: AC
  Administered 2015-02-22: 0.2 mg via ORAL
  Filled 2015-02-22: qty 2

## 2015-02-22 MED ORDER — SODIUM CHLORIDE 0.9 % IV BOLUS (SEPSIS)
1000.0000 mL | Freq: Once | INTRAVENOUS | Status: AC
  Administered 2015-02-22: 1000 mL via INTRAVENOUS

## 2015-02-22 MED ORDER — PROMETHAZINE HCL 25 MG/ML IJ SOLN
25.0000 mg | Freq: Once | INTRAMUSCULAR | Status: AC
  Administered 2015-02-22: 25 mg via INTRAVENOUS
  Filled 2015-02-22: qty 1

## 2015-02-22 MED ORDER — ONDANSETRON HCL 4 MG/2ML IJ SOLN
4.0000 mg | Freq: Once | INTRAMUSCULAR | Status: AC
  Administered 2015-02-22: 4 mg via INTRAVENOUS
  Filled 2015-02-22: qty 2

## 2015-02-22 MED ORDER — HYDROMORPHONE HCL 1 MG/ML IJ SOLN
1.0000 mg | Freq: Once | INTRAMUSCULAR | Status: AC
  Administered 2015-02-22: 1 mg via INTRAVENOUS
  Filled 2015-02-22: qty 1

## 2015-02-22 NOTE — ED Provider Notes (Signed)
CSN: [id number]     Arrival date & time 02/22/15  2140 History  This chart was scribed for Tilden FossaElizabeth Adriana Quinby, MD by Phillis HaggisGabriella Gaje, ED Scribe. This patient was seen in room MH12/MH12 and patient care was started at 10:16 PM.   Chief Complaint  Patient presents with  . Chest Pain   Patient is a 39 y.o. female presenting with chest pain. The history is provided by the patient. No language interpreter was used.  Chest Pain Associated symptoms: nausea and vomiting   Nausea:    Severity:  Moderate   Onset quality:  Sudden   Duration:  3 hours   Timing:  Constant   Progression:  Worsening Vomiting:    Severity:  Moderate   Timing:  Constant   Progression:  Worsening HPI Comments: Brandi Welch is a 39 y.o. female with a history of fibromyalgia and IC who presents to the Emergency Department complaining of vomiting, nausea, abdominal pain, diarrhea onset 3 hours ago. She states that she took Naltrexone, Zyrtec, Klonopin PTA, which started her symptoms. She states that her pain doctor gave it to her. She reports associated fever and chills. She reports history of liposuction. Patient denies chance of pregnancy. Symptoms are severe, constant, worsening. Symptoms started 30 minutes after taking naltrexone.  Past Medical History  Diagnosis Date  . Fibromyalgia   . Ulcer   . IC (interstitial cystitis)   . Anxiety   . Arthritis   . Knee pain, chronic    Past Surgical History  Procedure Laterality Date  . Nose surgery    . Cervical biopsy  w/ loop electrode excision    . Nasal septum surgery     No family history on file. History  Substance Use Topics  . Smoking status: Former Games developermoker  . Smokeless tobacco: Not on file  . Alcohol Use: No   OB History    No data available     Review of Systems  Constitutional: Positive for chills.  Cardiovascular: Positive for chest pain.  Gastrointestinal: Positive for nausea, vomiting and diarrhea.  All other systems reviewed and are  negative.  Allergies  Shellfish allergy  Home Medications   Prior to Admission medications   Medication Sig Start Date End Date Taking? Authorizing Provider  acyclovir (ZOVIRAX) 200 MG capsule Take 200 mg by mouth 2 (two) times daily.    Historical Provider, MD  carisoprodol (SOMA) 350 MG tablet Take 350 mg by mouth 4 (four) times daily.    Historical Provider, MD  carisoprodol-aspirin (SOMA COMPOUND) 200-325 MG per tablet Take 1 tablet by mouth 4 (four) times daily as needed for muscle spasms.    Historical Provider, MD  clonazePAM (KLONOPIN) 0.5 MG tablet Take 0.5 mg by mouth 3 (three) times daily as needed for anxiety.    Historical Provider, MD  co-enzyme Q-10 30 MG capsule Take 100 mg by mouth 3 (three) times daily.    Historical Provider, MD  gabapentin (NEURONTIN) 400 MG capsule Take 600 mg by mouth 3 (three) times daily.    Historical Provider, MD  lidocaine (LIDODERM) 5 % Place 1 patch onto the skin daily. Remove & Discard patch within 12 hours or as directed by MD    Historical Provider, MD  magnesium gluconate (MAGONATE) 500 MG tablet Take 250 mg by mouth 2 (two) times daily.    Historical Provider, MD  nitrofurantoin, macrocrystal-monohydrate, (MACROBID) 100 MG capsule Take 1 capsule (100 mg total) by mouth 2 (two) times daily. 07/01/13   Deliah BostonVrinda  Pickering, NP  pentosan polysulfate (ELMIRON) 100 MG capsule Take 200 mg by mouth 3 (three) times daily.    Historical Provider, MD  sertraline (ZOLOFT) 100 MG tablet Take 150 mg by mouth daily.    Historical Provider, MD  vitamin A 7500 UNIT capsule Take 7,500 Units by mouth daily.    Historical Provider, MD  vitamin B-12 (CYANOCOBALAMIN) 500 MCG tablet Take 300 mcg by mouth daily.    Historical Provider, MD  Vitamin D, Ergocalciferol, (DRISDOL) 50000 UNITS CAPS capsule Take 8,000 Units by mouth every 7 (seven) days.    Historical Provider, MD   BP 152/88 mmHg  Pulse 81  Temp(Src) 98.5 F (36.9 C) (Oral)  Resp 20  Ht  (1.702 m)   Wt 230 lb (104.327 kg)  BMI 36.01 kg/m2  SpO2 100%  LMP 02/15/2015   Physical Exam  Constitutional: She is oriented to person, place, and time. She appears well-developed and well-nourished.  HENT:  Head: Normocephalic and atraumatic.  Cardiovascular: Normal rate and regular rhythm.   No murmur heard. Pulmonary/Chest: Effort normal and breath sounds normal. No respiratory distress.  Abdominal: Soft. There is no tenderness. There is no rebound and no guarding.  Moderate diffuse tenderness  Musculoskeletal: She exhibits no edema or tenderness.  Neurological: She is alert and oriented to person, place, and time.  Skin: Skin is warm and dry.  Psychiatric: She has a normal mood and affect. Her behavior is normal.  Nursing note and vitals reviewed.   ED Course  Procedures (including critical care time) DIAGNOSTIC STUDIES: Oxygen Saturation is 100% on room air, normal by my interpretation.    COORDINATION OF CARE: 10:15 PM-Discussed treatment plan which includes labs and IV fluids with pt at bedside and pt agreed to plan.   Labs Review Labs Reviewed  COMPREHENSIVE METABOLIC PANEL - Abnormal; Notable for the following:    Glucose, Bld 162 (*)    Total Protein 8.9 (*)    GFR calc non Af Amer 78 (*)    All other components within normal limits  CBC WITH DIFFERENTIAL/PLATELET - Abnormal; Notable for the following:    Platelets 470 (*)    All other components within normal limits  URINALYSIS, ROUTINE W REFLEX MICROSCOPIC - Abnormal; Notable for the following:    APPearance CLOUDY (*)    Ketones, ur 15 (*)    Protein, ur 30 (*)    All other components within normal limits  URINE MICROSCOPIC-ADD ON - Abnormal; Notable for the following:    Squamous Epithelial / LPF FEW (*)    All other components within normal limits  LIPASE, BLOOD  PREGNANCY, URINE   Imaging Review No results found.   EKG Interpretation   Date/Time:  Wednesday February 22 2015 21:50:45 EDT Ventricular Rate:   91 PR Interval:  158 QRS Duration: 78 QT Interval:  370 QTC Calculation: 455 R Axis:   18 Text Interpretation:  Normal sinus rhythm with sinus arrhythmia  Nonspecific T wave abnormality Abnormal ECG Confirmed by Lincoln Brigham [phone])  on 02/22/2015 9:56:31 PM      MDM   Final diagnoses:  Opiate withdrawal    Patient here for evaluation of abdominal pain, vomiting, diarrhea that started very soon after taking a new prescription for naltrexone. History and presentation is consistent with acute narcotic withdrawal. Patient is partially improved on recheck in the emergency department. Her vomiting has stopped and her pain is improved. Discussed with patient that she needs to discontinue naltrexone at this time  and she can consider restarting it after she has been fully off all pain medications for at least 7-10 days. Discussed PCP follow-up as well as return precautions.  I personally performed the services described in this documentation, which was scribed in my presence. The recorded information has been reviewed and is accurate.    Tilden Fossa, MD 02/23/15 [phone]

## 2015-02-22 NOTE — ED Notes (Signed)
Pt ambulated to BR without difficulty, pt reports shes freezing, she continues to low moan while ambulating

## 2015-02-22 NOTE — ED Notes (Signed)
C/o abd and CP x 3 hrs

## 2015-02-22 NOTE — ED Notes (Signed)
When reviewing pt's med, pt has multiple narcotics and methadone-states she has not been on narcotic meds due to she missed and appt and meds were not refilled-pt states she has not been on methadone "for a while but i took some yesterday"

## 2015-02-23 MED ORDER — HYDROMORPHONE HCL 1 MG/ML IJ SOLN
1.0000 mg | Freq: Once | INTRAMUSCULAR | Status: AC
  Administered 2015-02-23: 1 mg via INTRAVENOUS
  Filled 2015-02-23: qty 1

## 2015-02-23 MED ORDER — PROMETHAZINE HCL 25 MG PO TABS: 25.0000 mg | ORAL_TABLET | Freq: Four times a day (QID) | ORAL | Status: DC | PRN

## 2015-02-23 NOTE — ED Notes (Signed)
Pt reports last medicine helped.  Pain is slightly lessened, Nausea has subsided. Tolerating po's last hour

## 2015-02-23 NOTE — ED Notes (Signed)
Pt verbalizes understanding of d/c instructions, keeps asking when the pain will completely go  Away.  Talked with her about  the importance of reviewing her current precriptions with primary MD.  She requested I call her mother , so she can go home

## 2015-02-23 NOTE — Discharge Instructions (Signed)
Stop taking naltrexone - this medication is causing your vomiting, diarrhea, and abdominal pain.  Follow up with your family doctor for close recheck.    Opioid Withdrawal Opioids are a group of narcotic drugs. They include the street drug heroin. They also include pain medicines, such as morphine, hydrocodone, oxycodone, and fentanyl. Opioid withdrawal is a group of characteristic physical and mental signs and symptoms. It typically occurs if you have been using opioids daily for several weeks or longer and stop using or rapidly decrease use. Opioid withdrawal can also occur if you have used opioids daily for a long time and are given a medicine to block the effect.  SIGNS AND SYMPTOMS Opioid withdrawal includes three or more of the following symptoms:   Depressed, anxious, or irritable mood.  Nausea or vomiting.  Muscle aches or spasms.   Watery eyes.   Runny nose.  Dilated pupils, sweating, or hairs standing on end.  Diarrhea or intestinal cramping.  Yawning.   Fever.  Increased blood pressure.  Fast pulse.  Restlessness or trouble sleeping. These signs and symptoms occur within several hours of stopping or reducing short-acting opioids, such as heroin. They can occur within 3 days of stopping or reducing long-acting opioids, such as methadone. Withdrawal begins within minutes of receiving a drug that blocks the effects of opioids, such as naltrexone or naloxone. DIAGNOSIS  Opioid use disorder is diagnosed by your health care provider. You will be asked about your symptoms, drug and alcohol use, medical history, and use of medicines. A physical exam may be done. Lab tests may be ordered. Your health care provider may have you see a mental health professional.  TREATMENT  The treatment for opioid withdrawal is usually provided by medical doctors with special training in substance use disorders (addiction specialists). The following medicines may be included in  treatment:  Opioids given in place of the abused opioid. They turn on opioid receptors in the brain and lessen or prevent withdrawal symptoms. They are gradually decreased (opioid substitution and taper).  Non-opioids that can lessen certain opioid withdrawal symptoms. They may be used alone or with opioid substitution and taper. Successful long-term recovery usually requires medicine, counseling, and group support. HOME CARE INSTRUCTIONS   Take medicines only as directed by your health care provider.  Check with your health care provider before starting new medicines.  Keep all follow-up visits as directed by your health care provider. SEEK MEDICAL CARE IF:  You are not able to take your medicines as directed.  Your symptoms get worse.  You relapse. SEEK IMMEDIATE MEDICAL CARE IF:  You have serious thoughts about hurting yourself or others.  You have a seizure.  You lose consciousness. Document Released: 10/24/2003 Document Revised: 03/07/2014 Document Reviewed: 11/03/2013 [facility] Patient Information 2015 [employer] [place]. This information is not intended to replace advice given to you by your health care provider. Make sure you discuss any questions you have with your health care provider.

## 2015-02-23 NOTE — ED Notes (Signed)
Pt is requesting prescription for nausea medicine

## 2015-03-13 DIAGNOSIS — G9689 Other specified disorders of central nervous system: Secondary | ICD-10-CM | POA: Insufficient documentation

## 2015-04-12 DIAGNOSIS — M170 Bilateral primary osteoarthritis of knee: Secondary | ICD-10-CM | POA: Insufficient documentation

## 2015-04-12 DIAGNOSIS — M1990 Unspecified osteoarthritis, unspecified site: Secondary | ICD-10-CM | POA: Insufficient documentation

## 2015-04-12 DIAGNOSIS — J309 Allergic rhinitis, unspecified: Secondary | ICD-10-CM | POA: Insufficient documentation

## 2016-01-11 DIAGNOSIS — T43011A Poisoning by tricyclic antidepressants, accidental (unintentional), initial encounter: Secondary | ICD-10-CM

## 2016-01-11 DIAGNOSIS — R9431 Abnormal electrocardiogram [ECG] [EKG]: Secondary | ICD-10-CM | POA: Insufficient documentation

## 2016-01-11 DIAGNOSIS — T424X1A Poisoning by benzodiazepines, accidental (unintentional), initial encounter: Secondary | ICD-10-CM | POA: Insufficient documentation

## 2016-01-11 DIAGNOSIS — T43014A Poisoning by tricyclic antidepressants, undetermined, initial encounter: Secondary | ICD-10-CM | POA: Insufficient documentation

## 2016-01-11 DIAGNOSIS — T402X1A Poisoning by other opioids, accidental (unintentional), initial encounter: Secondary | ICD-10-CM

## 2016-01-11 HISTORY — DX: Poisoning by benzodiazepines, accidental (unintentional), initial encounter: T42.4X1A

## 2016-01-11 HISTORY — DX: Poisoning by tricyclic antidepressants, undetermined, initial encounter: T43.014A

## 2016-01-11 HISTORY — DX: Poisoning by other opioids, accidental (unintentional), initial encounter: T40.2X1A

## 2016-01-11 HISTORY — DX: Poisoning by tricyclic antidepressants, accidental (unintentional), initial encounter: T43.011A

## 2016-01-13 DIAGNOSIS — F1111 Opioid abuse, in remission: Secondary | ICD-10-CM | POA: Insufficient documentation

## 2016-01-14 DIAGNOSIS — F609 Personality disorder, unspecified: Secondary | ICD-10-CM | POA: Insufficient documentation

## 2016-03-10 ENCOUNTER — Ambulatory Visit (HOSPITAL_BASED_OUTPATIENT_CLINIC_OR_DEPARTMENT_OTHER): Payer: PRIVATE HEALTH INSURANCE | Attending: Anesthesiology

## 2016-05-09 ENCOUNTER — Emergency Department (HOSPITAL_BASED_OUTPATIENT_CLINIC_OR_DEPARTMENT_OTHER): Payer: BLUE CROSS/BLUE SHIELD

## 2016-05-09 ENCOUNTER — Emergency Department (HOSPITAL_BASED_OUTPATIENT_CLINIC_OR_DEPARTMENT_OTHER)
Admission: EM | Admit: 2016-05-09 | Discharge: 2016-05-09 | Disposition: A | Discharge status: Home / Self Care | Payer: BLUE CROSS/BLUE SHIELD | Attending: Emergency Medicine | Admitting: Emergency Medicine

## 2016-05-09 ENCOUNTER — Encounter (HOSPITAL_BASED_OUTPATIENT_CLINIC_OR_DEPARTMENT_OTHER): Payer: Self-pay | Admitting: *Deleted

## 2016-05-09 DIAGNOSIS — R509 Fever, unspecified: Secondary | ICD-10-CM | POA: Insufficient documentation

## 2016-05-09 DIAGNOSIS — Z87891 Personal history of nicotine dependence: Secondary | ICD-10-CM | POA: Diagnosis not present

## 2016-05-09 DIAGNOSIS — R05 Cough: Secondary | ICD-10-CM | POA: Diagnosis not present

## 2016-05-09 DIAGNOSIS — R059 Cough, unspecified: Secondary | ICD-10-CM

## 2016-05-09 DIAGNOSIS — R1110 Vomiting, unspecified: Secondary | ICD-10-CM | POA: Insufficient documentation

## 2016-05-09 DIAGNOSIS — R0981 Nasal congestion: Secondary | ICD-10-CM | POA: Insufficient documentation

## 2016-05-09 MED ORDER — BENZONATATE 100 MG PO CAPS
100.0000 mg | ORAL_CAPSULE | Freq: Once | ORAL | Status: AC
  Administered 2016-05-09: 100 mg via ORAL
  Filled 2016-05-09: qty 1

## 2016-05-09 MED ORDER — ONDANSETRON 4 MG PO TBDP
4.0000 mg | ORAL_TABLET | Freq: Once | ORAL | Status: AC
  Administered 2016-05-09: 4 mg via ORAL
  Filled 2016-05-09: qty 1

## 2016-05-09 MED ORDER — BENZONATATE 100 MG PO CAPS
100.0000 mg | ORAL_CAPSULE | Freq: Three times a day (TID) | ORAL | Status: DC
Start: 2016-05-09 — End: 2017-08-06

## 2016-05-09 MED ORDER — ONDANSETRON 4 MG PO TBDP: 4.0000 mg | ORAL_TABLET | Freq: Three times a day (TID) | ORAL | Status: DC | PRN

## 2016-05-09 NOTE — ED Notes (Signed)
Pt c/o cough x 1 week DX bronchitis x 1 week ago , c/o cont cough

## 2016-05-09 NOTE — ED Notes (Signed)
Sprite given per pt request. 

## 2016-05-09 NOTE — ED Provider Notes (Signed)
CSN: [id number]     Arrival date & time 05/09/16  1531 History  By signing my name below, I, Placido SouLogan Joldersma, attest that this documentation has been prepared under the direction and in the presence of Garlon HatchetLisa M Edwena Mayorga, PA-C. Electronically Signed: Placido SouLogan Joldersma, ED Scribe. 05/09/2016. 4:51 PM.   Chief Complaint  Patient presents with  . Cough   The history is provided by the patient. No language interpreter was used.    HPI Comments: Brandi Welch is a 40 y.o. female who presents to the Emergency Department complaining of constant, moderate, productive cough x 1 week. Pt states she was seen by her PCP 1 week ago and was dx with laryngitis and bronchitis and was d/c with amoxicillin and prednisone noting that she discontinued the prednisone due to it causing a lack of sleep and completed her amoxacillin 5 days ago. She states her bronchitis/laryngitis symptoms have continued with associated productive cough, fever and chills. She states beginning yesterday she began experiencing intermittent post tussive emesis. She states she is vomiting bile. Pt has taken Zantac without significant relief. She has no known drug allergies. Pt denies any other associated symptoms at this time.  No abdominal pain, diarrhea, fever, chills, sweats.  VSS.  Past Medical History  Diagnosis Date  . Fibromyalgia   . Ulcer   . IC (interstitial cystitis)   . Anxiety   . Arthritis   . Knee pain, chronic    Past Surgical History  Procedure Laterality Date  . Nose surgery    . Cervical biopsy  w/ loop electrode excision    . Nasal septum surgery     History reviewed. No pertinent family history. Social History  Substance Use Topics  . Smoking status: Former Games developermoker  . Smokeless tobacco: None  . Alcohol Use: No   OB History    No data available     Review of Systems  Constitutional: Positive for fever and chills.  HENT: Positive for congestion.   Respiratory: Positive for cough.   Gastrointestinal:  Positive for vomiting.  All other systems reviewed and are negative.  Allergies  Shellfish allergy  Home Medications   Prior to Admission medications   Medication Sig Start Date End Date Taking? Authorizing Provider  acyclovir (ZOVIRAX) 200 MG capsule Take 200 mg by mouth 2 (two) times daily.    Historical Provider, MD  carisoprodol (SOMA) 350 MG tablet Take 350 mg by mouth 4 (four) times daily.    Historical Provider, MD  carisoprodol-aspirin (SOMA COMPOUND) 200-325 MG per tablet Take 1 tablet by mouth 4 (four) times daily as needed for muscle spasms.    Historical Provider, MD  clonazePAM (KLONOPIN) 0.5 MG tablet Take 0.5 mg by mouth 3 (three) times daily as needed for anxiety.    Historical Provider, MD  co-enzyme Q-10 30 MG capsule Take 100 mg by mouth 3 (three) times daily.    Historical Provider, MD  gabapentin (NEURONTIN) 400 MG capsule Take 600 mg by mouth 3 (three) times daily.    Historical Provider, MD  lidocaine (LIDODERM) 5 % Place 1 patch onto the skin daily. Remove & Discard patch within 12 hours or as directed by MD    Historical Provider, MD  magnesium gluconate (MAGONATE) 500 MG tablet Take 250 mg by mouth 2 (two) times daily.    Historical Provider, MD  nitrofurantoin, macrocrystal-monohydrate, (MACROBID) 100 MG capsule Take 1 capsule (100 mg total) by mouth 2 (two) times daily. 07/01/13   Teressa LowerVrinda Pickering, NP  pentosan polysulfate (ELMIRON) 100 MG capsule Take 200 mg by mouth 3 (three) times daily.    Historical Provider, MD  promethazine (PHENERGAN) 25 MG tablet Take 1 tablet (25 mg total) by mouth every 6 (six) hours as needed for nausea or vomiting. 02/23/15   Tilden Fossa, MD  sertraline (ZOLOFT) 100 MG tablet Take 150 mg by mouth daily.    Historical Provider, MD  vitamin A 7500 UNIT capsule Take 7,500 Units by mouth daily.    Historical Provider, MD  vitamin B-12 (CYANOCOBALAMIN) 500 MCG tablet Take 300 mcg by mouth daily.    Historical Provider, MD  Vitamin D,  Ergocalciferol, (DRISDOL) 50000 UNITS CAPS capsule Take 8,000 Units by mouth every 7 (seven) days.    Historical Provider, MD   BP 123/81 mmHg  Pulse 79  Temp(Src) 98.4 F (36.9 C) (Oral)  Resp 18  Ht  (1.702 m)  Wt 230 lb (104.327 kg)  BMI 36.01 kg/m2  SpO2 97%  LMP 04/25/2016    Physical Exam  Constitutional: She is oriented to person, place, and time. She appears well-developed and well-nourished.  HENT:  Head: Normocephalic and atraumatic.  Right Ear: Hearing, tympanic membrane, external ear and ear canal normal.  Left Ear: Hearing, tympanic membrane, external ear and ear canal normal.  Nose: Mucosal edema present. Right sinus exhibits no maxillary sinus tenderness and no frontal sinus tenderness. Left sinus exhibits no maxillary sinus tenderness and no frontal sinus tenderness.  Mouth/Throat: Uvula is midline, oropharynx is clear and moist and mucous membranes are normal.  + nasal congestion  Eyes: Conjunctivae and EOM are normal. Pupils are equal, round, and reactive to light.  Neck: Normal range of motion.  Cardiovascular: Normal rate, regular rhythm and normal heart sounds.   Pulmonary/Chest: Effort normal and breath sounds normal. No respiratory distress. She has no wheezes.  Dry cough, no emesis  Abdominal: Soft. Bowel sounds are normal.  Musculoskeletal: Normal range of motion.  Neurological: She is alert and oriented to person, place, and time.  Skin: Skin is warm and dry.  Psychiatric: She has a normal mood and affect.  Nursing note and vitals reviewed.   ED Course  Procedures  DIAGNOSTIC STUDIES: Oxygen Saturation is 97% on RA, normal by my interpretation.    COORDINATION OF CARE: 4:50 PM Discussed next steps with pt. Pt verbalized understanding and is agreeable with the plan.   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 Lungs clear. No pleural effusion or pneumothorax. Bones unremarkable. IMPRESSION: No acute abnormalities. Electronically Signed   By: Ulyses Southward M.D.   On: 05/09/2016 16:29   I have personally reviewed and evaluated these images as part of my medical decision-making.   EKG Interpretation None      MDM   Final diagnoses:  Cough  Post-tussive emesis   41 year old female here with intermittently productive cough for the past week. Completed course of amoxicillin last week without improvement. She is afebrile, nontoxic. She has a dry cough on exam. She reports some posttussive emesis for the past 2 days, no active emesis here in the ED. She is tolerating oral fluids well. Her chest x-ray is negative for any acute findings. Suspect viral nature  of her symptoms. Discharge home with supportive care including Tessalon and Zofran. Follow-up with PCP.  Discussed plan with patient, he/she acknowledged understanding and agreed with plan of care.  Return precautions given for new or worsening symptoms.  I personally performed the services described in this documentation, which was scribed in my presence. The recorded information has been reviewed and is accurate.  Garlon HatchetLisa M Terralyn Matsumura, PA-C 05/09/16 1739  Vanetta MuldersScott Zackowski, MD 05/11/16 [phone]

## 2016-05-09 NOTE — Discharge Instructions (Signed)
Your symptoms today are likely viral in nature.  They may last for a total of 10-14 days. Take the prescribed medication as directed. Follow-up with your primary care doctor. Return to the ED for new or worsening symptoms.

## 2017-03-03 ENCOUNTER — Other Ambulatory Visit (HOSPITAL_BASED_OUTPATIENT_CLINIC_OR_DEPARTMENT_OTHER): Payer: Self-pay | Admitting: Physician Assistant

## 2017-03-03 DIAGNOSIS — Z1231 Encounter for screening mammogram for malignant neoplasm of breast: Secondary | ICD-10-CM

## 2017-08-03 ENCOUNTER — Emergency Department
Admission: EM | Admit: 2017-08-03 | Discharge: 2017-08-03 | Disposition: A | Discharge status: Home / Self Care | Payer: BLUE CROSS/BLUE SHIELD | Source: Home / Self Care | Attending: Emergency Medicine | Admitting: Emergency Medicine

## 2017-08-03 ENCOUNTER — Emergency Department (INDEPENDENT_AMBULATORY_CARE_PROVIDER_SITE_OTHER): Payer: BLUE CROSS/BLUE SHIELD

## 2017-08-03 ENCOUNTER — Encounter: Payer: Self-pay | Admitting: Emergency Medicine

## 2017-08-03 DIAGNOSIS — M542 Cervicalgia: Secondary | ICD-10-CM

## 2017-08-03 DIAGNOSIS — L03221 Cellulitis of neck: Secondary | ICD-10-CM

## 2017-08-03 DIAGNOSIS — G44209 Tension-type headache, unspecified, not intractable: Secondary | ICD-10-CM

## 2017-08-03 DIAGNOSIS — H53143 Visual discomfort, bilateral: Secondary | ICD-10-CM

## 2017-08-03 MED ORDER — MUPIROCIN CALCIUM 2 % EX CREA: 1.0000 "application " | TOPICAL_CREAM | Freq: Two times a day (BID) | CUTANEOUS | 2 refills | Status: DC

## 2017-08-03 MED ORDER — DOXYCYCLINE HYCLATE 100 MG PO CAPS: 100.0000 mg | ORAL_CAPSULE | Freq: Two times a day (BID) | ORAL | 0 refills | Status: DC

## 2017-08-03 MED ORDER — MUPIROCIN CALCIUM 2 % EX CREA: 1.0000 "application " | TOPICAL_CREAM | Freq: Two times a day (BID) | CUTANEOUS | 0 refills | Status: DC

## 2017-08-03 NOTE — Discharge Instructions (Signed)
Please follow-up with the ophthalmologist. Please follow-up within neurologist. Clean the area on your neck with soap and water twice a day followed by cream application. Take doxycycline twice a day.

## 2017-08-03 NOTE — ED Triage Notes (Signed)
Reports yesterday noticing a lesion on right side of the back of her neck. She also reports chronic neck pain, headaches, and GI problems which she has providers lined up for these chronic conditions.

## 2017-08-03 NOTE — ED Provider Notes (Addendum)
[facility]    CSN: [id number] Arrival date & time: 08/03/17  1701     History   Chief Complaint Chief Complaint  Patient presents with  . Mass    right side of back neck    HPI Brandi Welch is a 41 y.o. female.  This patient has multiple complaints today. She has a history of fibromyalgia and interstitial cystitis which are chronic problems for her. She is followed by pain management in [place]. Her complaint today is of difficulty with light sensitivity for the last 3-4 weeks. She has an appointment to see a neurologist in 2 weeks. She has not had any neck stiffness or numbness in her arms or legs. 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 Actually her main complaint today is of a red tender area on the back of her neck. 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 She has not had any fever or chills or neck stiffness. She initially shaved this area with a razor about 3 weeks ago and soon after that developed a headache and pain on the right side of her neck. HPI  Past Medical History:  Diagnosis Date  . Anxiety   . Arthritis   . Fibromyalgia   . IC (interstitial cystitis)   . Knee pain, chronic   . Ulcer     There are no active problems to display for this patient.   Past Surgical History:  Procedure Laterality Date  . CERVICAL BIOPSY  W/ LOOP ELECTRODE EXCISION    . NASAL SEPTUM SURGERY    . NOSE SURGERY      OB History    No data available       Home Medications    Prior to Admission medications   Medication Sig Start Date End Date Taking? Authorizing Provider  lithium carbonate 300 MG capsule Take 300 mg by mouth 2 (two) times daily with a meal.   Yes [provider]  oxyCODONE (OXYCONTIN) 40 mg 12 hr tablet Take 40 mg by mouth every 12 (twelve) hours.   Yes [provider]  oxyCODONE (ROXICODONE) 15 MG  immediate release tablet Take 15 mg by mouth every 6 (six) hours as needed for pain.   Yes [provider]  acyclovir (ZOVIRAX) 200 MG capsule Take 200 mg by mouth 2 (two) times daily.    [provider]  benzonatate (TESSALON) 100 MG capsule Take 1 capsule (100 mg total) by mouth every 8 (eight) hours. 05/09/16   Garlon Hatchet, PA-C  carisoprodol (SOMA) 350 MG tablet Take 350 mg by mouth 4 (four) times daily.    [provider]  carisoprodol-aspirin (SOMA COMPOUND) 200-325 MG per tablet Take 1 tablet by mouth 4 (four) times daily as needed for muscle spasms.    [provider]  clonazePAM (KLONOPIN) 0.5 MG tablet Take 0.5 mg by mouth 3 (three) times daily as needed for anxiety.    [provider]  co-enzyme Q-10 30 MG capsule Take 100 mg by mouth 3 (three) times daily.    [provider]  doxycycline (VIBRAMYCIN) 100 MG capsule Take 1 capsule (100 mg total) by mouth 2 (two) times daily. 08/03/17   Collene Gobble, MD  gabapentin (NEURONTIN) 400 MG capsule Take 600 mg by mouth 3 (three) times daily.    [provider]  lidocaine (LIDODERM) 5 %  Place 1 patch onto the skin daily. Remove & Discard patch within 12 hours or as directed by MD    [provider]  magnesium gluconate (MAGONATE) 500 MG tablet Take 250 mg by mouth 2 (two) times daily.    [provider]  mupirocin cream (BACTROBAN) 2 % Apply 1 application topically 2 (two) times daily. 08/03/17   Collene Gobble, MD  nitrofurantoin, macrocrystal-monohydrate, (MACROBID) 100 MG capsule Take 1 capsule (100 mg total) by mouth 2 (two) times daily. 07/01/13   Teressa Lower, NP  ondansetron (ZOFRAN ODT) 4 MG disintegrating tablet Take 1 tablet (4 mg total) by mouth every 8 (eight) hours as needed for nausea. 05/09/16   Garlon Hatchet, PA-C  pentosan polysulfate (ELMIRON) 100 MG capsule Take 200 mg by mouth 3 (three) times daily.    [provider]  promethazine  (PHENERGAN) 25 MG tablet Take 1 tablet (25 mg total) by mouth every 6 (six) hours as needed for nausea or vomiting. 02/23/15   Tilden Fossa, MD  sertraline (ZOLOFT) 100 MG tablet Take 150 mg by mouth daily.    [provider]  vitamin A 7500 UNIT capsule Take 7,500 Units by mouth daily.    [provider]  vitamin B-12 (CYANOCOBALAMIN) 500 MCG tablet Take 300 mcg by mouth daily.    [provider]  Vitamin D, Ergocalciferol, (DRISDOL) 50000 UNITS CAPS capsule Take 8,000 Units by mouth every 7 (seven) days.    [provider]    Family History History reviewed. No pertinent family history.  Social History Social History  Substance Use Topics  . Smoking status: Former Games developer  . Smokeless tobacco: Never Used  . Alcohol use No     Allergies   Shellfish allergy and Naloxone   Review of Systems Review of Systems  Constitutional: Negative for fever.  Eyes: Positive for photophobia and pain.  Genitourinary: Positive for dysuria.  Musculoskeletal: Positive for arthralgias, back pain, myalgias and neck pain. Negative for neck stiffness.  Skin: Positive for rash and wound.  Neurological: Positive for headaches. Negative for seizures, syncope, weakness and numbness.  Hematological: Negative for adenopathy.  Psychiatric/Behavioral: Positive for dysphoric mood. The patient is nervous/anxious.      Physical Exam Triage Vital Signs ED Triage Vitals  Enc Vitals Group     BP 08/03/17 1756 126/85     Pulse Rate 08/03/17 1756 81     Resp 08/03/17 1756 16     Temp 08/03/17 1756 98.4 F (36.9 C)     Temp Source 08/03/17 1756 Oral     SpO2 08/03/17 1756 96 %     Weight 08/03/17 1757 210 lb (95.3 kg)     Height 08/03/17 1757  (1.702 m)     Head Circumference --      Peak Flow --      Pain Score --      Pain Loc --      Pain Edu? --      Excl. in GC? --    No data found.   Updated Vital Signs BP 126/85 (BP Location: Left Arm)   Pulse 81    Temp 98.4 F (36.9 C) (Oral)   Resp 16   Ht  (1.702 m)   Wt 210 lb (95.3 kg)   LMP 07/25/2017 (Exact Date)   SpO2 96%   BMI 32.89 kg/m   Visual Acuity Right Eye Distance:   Left Eye Distance:   Bilateral Distance:  Right Eye Near:   Left Eye Near:    Bilateral Near:     Physical Exam  Constitutional: She appears well-developed and well-nourished.  HENT:  Head: Normocephalic.  Eyes: Pupils are equal, round, and reactive to light.  I could not get a good visualization of the disc margins. Patient did appear to be light sensitive. Pupils were equal size and reactive with normal extraocular muscle movements.  Neck: Normal range of motion. Neck supple. No tracheal deviation present.  Musculoskeletal: Normal range of motion.  Neurological: No cranial nerve deficit or sensory deficit. She exhibits normal muscle tone.  Skin:  There is a 2 x 2 cm superficial crusted area right posterior neck with surrounding erythema.     UC Treatments / Results  Labs (all labs ordered are listed, but only abnormal results are displayed) Labs Reviewed  WOUND CULTURE    EKG  EKG Interpretation None       Radiology Dg Cervical Spine 2-3 Views  Result Date: 08/03/2017 CLINICAL DATA:  Right neck open wound extruding pus.  Neck pain. EXAM: CERVICAL SPINE - 2-3 VIEW COMPARISON:  Cervical spine CT dated 09/24/2011. FINDINGS: Again demonstrated is mild reversal of the normal cervical lordosis. Otherwise, normal appearing bones. Normal appearing airway. No visible soft tissue abnormality. No soft tissue gas. Metallic dental work. IMPRESSION: No acute radiographic abnormality. Electronically Signed   By: Beckie Salts M.D.   On: 08/03/2017 18:49    Procedures Procedures (including critical care time)  Medications Ordered in UC Medications - No data to display   Initial Impression / Assessment and Plan / UC Course  I have reviewed the triage vital signs and the nursing  notes.  Pertinent labs & imaging results that were available during my care of the patient were reviewed by me and considered in my medical decision making (see chart for details). This patient has multiple medical issues. We'll check a two-view of the cervical spine to rule out cervical disc disease. She already has an appointment to see the neurologist for evaluation. I advised her to go ahead and see her ophthalmologist this week for a good thorough eye exam. She does have an area on the back of her neck which appears to be infected. I believe this started after her shaving the back of her neck. We'll treat this area with doxycycline twice a day along with Bactroban ointment.this area was cultured.      Final Clinical Impressions(s) / UC Diagnoses   Final diagnoses:  Cellulitis of neck  Neck pain  Photophobia of both eyes  Tension-type headache, not intractable, unspecified chronicity pattern    New Prescriptions New Prescriptions   DOXYCYCLINE (VIBRAMYCIN) 100 MG CAPSULE    Take 1 capsule (100 mg total) by mouth 2 (two) times daily.   MUPIROCIN CREAM (BACTROBAN) 2 %    Apply 1 application topically 2 (two) times daily.     Controlled Substance Prescriptions [place] Controlled Substance Registry consulted? No   Collene Gobble, MD 08/03/17 Lynelle Smoke    Collene Gobble, MD 08/03/17 [phone]

## 2017-08-04 ENCOUNTER — Telehealth: Payer: Self-pay | Admitting: Emergency Medicine

## 2017-08-04 NOTE — Telephone Encounter (Signed)
Patient was treated yesterday with doxy, she is unable to tolerate  She is taking with food but she is vomiting. The culture is not back, I advised her to stop the medication until she hears from [place].

## 2017-08-04 NOTE — Telephone Encounter (Signed)
Called patient advised her to use warm compresses, bactroban, OTC probiotic or yogurt with probiotics, hydrate with clear fluids, we will call her tomorrow with lab results.

## 2017-08-04 NOTE — Telephone Encounter (Signed)
She may continue to use the topical Bactroban as prescribed at time of visit.  Encourage warm compresses. May try over the counter probiotics and/or yogurt with probiotics with help with GI upset.  Alternative antibiotic for suspected MRSA is also a strong antibiotic, which could cause n/v/d.  Okay to hold off unless symptoms significantly worsening before culture results come back.  IF worsening, please call in Clindamycin  4x daily for 7 days.

## 2017-08-06 ENCOUNTER — Emergency Department (HOSPITAL_BASED_OUTPATIENT_CLINIC_OR_DEPARTMENT_OTHER)
Admission: EM | Admit: 2017-08-06 | Discharge: 2017-08-06 | Disposition: A | Discharge status: Home / Self Care | Payer: BLUE CROSS/BLUE SHIELD | Attending: Emergency Medicine | Admitting: Emergency Medicine

## 2017-08-06 ENCOUNTER — Emergency Department (HOSPITAL_BASED_OUTPATIENT_CLINIC_OR_DEPARTMENT_OTHER): Payer: BLUE CROSS/BLUE SHIELD

## 2017-08-06 ENCOUNTER — Encounter (HOSPITAL_BASED_OUTPATIENT_CLINIC_OR_DEPARTMENT_OTHER): Payer: Self-pay

## 2017-08-06 DIAGNOSIS — K5289 Other specified noninfective gastroenteritis and colitis: Secondary | ICD-10-CM | POA: Diagnosis not present

## 2017-08-06 DIAGNOSIS — R112 Nausea with vomiting, unspecified: Secondary | ICD-10-CM

## 2017-08-06 DIAGNOSIS — K529 Noninfective gastroenteritis and colitis, unspecified: Secondary | ICD-10-CM

## 2017-08-06 DIAGNOSIS — R1110 Vomiting, unspecified: Secondary | ICD-10-CM | POA: Diagnosis present

## 2017-08-06 DIAGNOSIS — IMO0002 Reserved for concepts with insufficient information to code with codable children: Secondary | ICD-10-CM | POA: Insufficient documentation

## 2017-08-06 LAB — URINALYSIS, ROUTINE W REFLEX MICROSCOPIC
GLUCOSE, UA: NEGATIVE mg/dL
HGB URINE DIPSTICK: NEGATIVE
Ketones, ur: 40 mg/dL — AB
Leukocytes, UA: NEGATIVE
Nitrite: NEGATIVE
PROTEIN: NEGATIVE mg/dL
SPECIFIC GRAVITY, URINE: 1.025 (ref 1.005–1.030)
pH: 6.5 (ref 5.0–8.0)

## 2017-08-06 LAB — COMPREHENSIVE METABOLIC PANEL
ALBUMIN: 4.5 g/dL (ref 3.5–5.0)
ALT: 19 U/L (ref 14–54)
AST: 18 U/L (ref 15–41)
Alkaline Phosphatase: 64 U/L (ref 38–126)
Anion gap: 9 (ref 5–15)
BUN: 9 mg/dL (ref 6–20)
CHLORIDE: 106 mmol/L (ref 101–111)
CO2: 25 mmol/L (ref 22–32)
CREATININE: 0.91 mg/dL (ref 0.44–1.00)
Calcium: 9.7 mg/dL (ref 8.9–10.3)
GFR calc Af Amer: >60 mL/min (ref >60)
GLUCOSE: 110 mg/dL — AB (ref 65–99)
POTASSIUM: 2.8 mmol/L — AB (ref 3.5–5.1)
SODIUM: 140 mmol/L (ref 135–145)
Total Bilirubin: 0.5 mg/dL (ref 0.3–1.2)
Total Protein: 8.2 g/dL — ABNORMAL HIGH (ref 6.5–8.1)

## 2017-08-06 LAB — CBC WITH DIFFERENTIAL/PLATELET
Basophils Absolute: 0 10*3/uL (ref 0.0–0.1)
Basophils Relative: 0 %
EOS ABS: 0 10*3/uL (ref 0.0–0.7)
EOS PCT: 0 %
HCT: 42 % (ref 36.0–46.0)
Hemoglobin: 14.7 g/dL (ref 12.0–15.0)
LYMPHS ABS: 3.2 10*3/uL (ref 0.7–4.0)
LYMPHS PCT: 32 %
MCH: 29.3 pg (ref 26.0–34.0)
MCHC: 35 g/dL (ref 30.0–36.0)
MCV: 83.8 fL (ref 78.0–100.0)
MONO ABS: 0.7 10*3/uL (ref 0.1–1.0)
MONOS PCT: 7 %
Neutro Abs: 6 10*3/uL (ref 1.7–7.7)
Neutrophils Relative %: 61 %
PLATELETS: 489 10*3/uL — AB (ref 150–400)
RBC: 5.01 MIL/uL (ref 3.87–5.11)
RDW: 13.4 % (ref 11.5–15.5)
WBC: 10 10*3/uL (ref 4.0–10.5)

## 2017-08-06 LAB — WOUND CULTURE
MICRO NUMBER: 81085333
RESULT:: NO GROWTH
SPECIMEN QUALITY: ADEQUATE

## 2017-08-06 LAB — LIPASE, BLOOD: LIPASE: 22 U/L (ref 11–51)

## 2017-08-06 LAB — PREGNANCY, URINE: PREG TEST UR: NEGATIVE

## 2017-08-06 MED ORDER — PROMETHAZINE HCL 25 MG PO TABS: 25.0000 mg | ORAL_TABLET | Freq: Four times a day (QID) | ORAL | 0 refills | Status: DC | PRN

## 2017-08-06 MED ORDER — PROMETHAZINE HCL 25 MG/ML IJ SOLN
12.5000 mg | Freq: Once | INTRAMUSCULAR | Status: AC
  Administered 2017-08-06: 12.5 mg via INTRAVENOUS
  Filled 2017-08-06: qty 1

## 2017-08-06 MED ORDER — METRONIDAZOLE 500 MG PO TABS: 500.0000 mg | ORAL_TABLET | Freq: Two times a day (BID) | ORAL | 0 refills | Status: DC

## 2017-08-06 MED ORDER — IOPAMIDOL (ISOVUE-300) INJECTION 61%
100.0000 mL | Freq: Once | INTRAVENOUS | Status: AC | PRN
  Administered 2017-08-06: 100 mL via INTRAVENOUS

## 2017-08-06 MED ORDER — DICYCLOMINE HCL 20 MG PO TABS: 20.0000 mg | ORAL_TABLET | Freq: Two times a day (BID) | ORAL | 0 refills | Status: DC

## 2017-08-06 MED ORDER — CIPROFLOXACIN HCL 500 MG PO TABS: 500.0000 mg | ORAL_TABLET | Freq: Two times a day (BID) | ORAL | 0 refills | Status: DC

## 2017-08-06 MED ORDER — SODIUM CHLORIDE 0.9 % IV BOLUS (SEPSIS)
1000.0000 mL | Freq: Once | INTRAVENOUS | Status: AC
  Administered 2017-08-06: 1000 mL via INTRAVENOUS

## 2017-08-06 MED ORDER — TRAMADOL HCL 50 MG PO TABS: 50.0000 mg | ORAL_TABLET | Freq: Four times a day (QID) | ORAL | 0 refills | Status: DC | PRN

## 2017-08-06 MED FILL — metroNIDAZOLE 500 MG TABS: 500 | 7 days supply | Qty: 14 | Fill #0

## 2017-08-06 MED FILL — PROMETHAZINE 25 MG TABLET: 25 | 8 days supply | Qty: 30 | Fill #0

## 2017-08-06 MED FILL — DICYCLOMINE 20 MG TABLET: 20 | 10 days supply | Qty: 20 | Fill #0

## 2017-08-06 MED FILL — CIPROFLOXACIN HCL 500 MG TA: 500 | 7 days supply | Qty: 14 | Fill #0

## 2017-08-06 MED FILL — traMADol HCL 50 MG TABS: 50 | 4 days supply | Qty: 15 | Fill #0

## 2017-08-06 NOTE — ED Triage Notes (Addendum)
C/o vomiting x 3 days-bloody emesis x 2 days-pt states she was dx with MRSA infection to neck 4 days ago-started on doxycycline-unable to keep abx down -NAD-steady gait

## 2017-08-06 NOTE — Discharge Instructions (Signed)
We believe your symptoms are caused by diverticulitis.  Most of the time this condition (please read through the included information) can be cured with outpatient antibiotics.  Please take the full course of prescribed medication(s) and follow up with the doctors recommended above.  Return to the ED if your abdominal pain worsens or fails to improve, you develop bloody vomiting, bloody diarrhea, you are unable to tolerate fluids due to vomiting, fever greater than 101, or other symptoms that concern you.  Take Vicodin as prescribed for severe pain. Do not drink alcohol, drive or participate in any other potentially dangerous activities while taking this medication as it may make you sleepy. Do not take this medication with any other sedating medications, either prescription or over-the-counter. If you were prescribed Percocet or Vicodin, do not take these with acetaminophen (Tylenol) as it is already contained within these medications.   This medication is an opiate (or narcotic) pain medication and can be habit forming.  Use it as little as possible to achieve adequate pain control.  Do not use or use it with extreme caution if you have a history of opiate abuse or dependence.  This medication is intended for your use only - do not give any to anyone else and keep it in a secure place where nobody else, especially children, have access to it.  It will also cause or worsen constipation, so you may want to consider taking an over-the-counter stool softener while you are taking this medication.   Diverticulitis Diverticulitis is inflammation or infection of small pouches in your colon that form when you have a condition called diverticulosis. The pouches in your colon are called diverticula. Your colon, or large intestine, is where water is absorbed and stool is formed. Complications of diverticulitis can include: Bleeding. Severe infection. Severe pain. Perforation of your colon. Obstruction of your  colon. CAUSES  Diverticulitis is caused by bacteria. Diverticulitis happens when stool becomes trapped in diverticula. This allows bacteria to grow in the diverticula, which can lead to inflammation and infection. RISK FACTORS People with diverticulosis are at risk for diverticulitis. Eating a diet that does not include enough fiber from fruits and vegetables may make diverticulitis more likely to develop. SYMPTOMS  Symptoms of diverticulitis may include: Abdominal pain and tenderness. The pain is normally located on the left side of the abdomen, but may occur in other areas. Fever and chills. Bloating. Cramping. Nausea. Vomiting. Constipation. Diarrhea. Blood in your stool. DIAGNOSIS  Your health care provider will ask you about your medical history and do a physical exam. You may need to have tests done because many medical conditions can cause the same symptoms as diverticulitis. Tests may include: Blood tests. Urine tests. Imaging tests of the abdomen, including X-rays and CT scans. When your condition is under control, your health care provider may recommend that you have a colonoscopy. A colonoscopy can show how severe your diverticula are and whether something else is causing your symptoms. TREATMENT  Most cases of diverticulitis are mild and can be treated at home. Treatment may include: Taking over-the-counter pain medicines. Following a clear liquid diet. Taking antibiotic medicines by mouth for 7-10 days. More severe cases may be treated at a hospital. Treatment may include: Not eating or drinking. Taking prescription pain medicine. Receiving antibiotic medicines through an IV tube. Receiving fluids and nutrition through an IV tube. Surgery. HOME CARE INSTRUCTIONS  Follow your health care provider's instructions carefully. Follow a full liquid diet or other diet as directed  by your health care provider. After your symptoms improve, your health care provider may tell you  to change your diet. He or she may recommend you eat a high-fiber diet. Fruits and vegetables are good sources of fiber. Fiber makes it easier to pass stool. °Take fiber supplements or probiotics as directed by your health care provider. °Only take medicines as directed by your health care provider. °Keep all your follow-up appointments. °SEEK MEDICAL CARE IF:  °Your pain does not improve. °You have a hard time eating food. °Your bowel movements do not return to normal. °SEEK IMMEDIATE MEDICAL CARE IF:  °Your pain becomes worse. °Your symptoms do not get better. °Your symptoms suddenly get worse. °You have a fever. °You have repeated vomiting. °You have bloody or black, tarry stools. °MAKE SURE YOU:  °Understand these instructions. °Will watch your condition. °Will get help right away if you are not doing well or get worse. °Document Released: 07/31/2005 Document Revised: 10/26/2013 Document Reviewed: 09/15/2013 °[employer]® Patient Information ©2015 [employer]. This information is not intended to replace advice given to you by your health care provider. Make sure you discuss any questions you have with your health care provider. ° ° °

## 2017-08-06 NOTE — ED Notes (Signed)
ED Provider at bedside. 

## 2017-08-06 NOTE — ED Provider Notes (Signed)
Emergency Department Provider Note   I have reviewed the triage vital signs and the nursing notes.   HISTORY  Chief Complaint Emesis   HPI Brandi Welch is a 41 y.o. female with PMH of anxiety, fibromyalgia, and chronic knee pain presents emergency department for evaluation of 3 days of worsening emesis. Patient states she's had almost daily vomiting for the past 2 months. She been treating with Zofran but recently ran out. She was recently treated for presumed MRSA infection to the neck and has been taking doxycycline for the past 2 days. Vomiting worsened after starting the antibiotic. She recently learned that the infection her neck was not MRSA. She's had some lower abdominal discomfort denies any vaginal bleeding or discharge. LMP was the 21st of this month. No dysuria, hesitancy, urgency. No radiation of symptoms. No sick contacts. Denies diarrhea.   Past Medical History:  Diagnosis Date  . Anxiety   . Arthritis   . Fibromyalgia   . IC (interstitial cystitis)   . Knee pain, chronic   . Ulcer     There are no active problems to display for this patient.   Past Surgical History:  Procedure Laterality Date  . CERVICAL BIOPSY  W/ LOOP ELECTRODE EXCISION    . NASAL SEPTUM SURGERY    . NOSE SURGERY      Current Outpatient Rx  . Order #: [id number] Class: Historical Med  . Order #: [id number] Class: Print  . Order #: [id number] Class: Print  . Order #: [id number] Class: Print  . Order #: [id number] Class: Historical Med  . Order #: [id number] Class: Print  . Order #: [id number] Class: Print  . Order #: [id number] Class: Print  . Order #: [id number] Class: Print  . Order #: [id number] Class: Print  . Order #: [id number] Class: Historical Med  . Order #: [id number] Class: Historical Med    Allergies Shellfish allergy and Naloxone  No family history on file.  Social History Social History  Substance Use Topics  . Smoking status: Former Games developer  . Smokeless tobacco: Never Used    . Alcohol use No    Review of Systems  Constitutional: No fever/chills Eyes: No visual changes. ENT: No sore throat. Cardiovascular: Denies chest pain. Respiratory: Denies shortness of breath. Gastrointestinal: Positive abdominal pain. Positive nausea and vomiting.  No diarrhea.  No constipation. Genitourinary: Negative for dysuria. Musculoskeletal: Negative for back pain. Skin: Healing rash on neck.  Neurological: Negative for headaches, focal weakness or numbness.  10-point ROS otherwise negative.  ____________________________________________   PHYSICAL EXAM:  VITAL SIGNS: ED Triage Vitals  Enc Vitals Group     BP 08/06/17 1341 (!) 146/103     Pulse Rate 08/06/17 1341 (!) 103     Resp 08/06/17 1341 18     Temp 08/06/17 1341 98.7 F (37.1 C)     Temp Source 08/06/17 1341 Oral     SpO2 08/06/17 1341 96 %     Weight 08/06/17 1341 225 lb (102.1 kg)     Height 08/06/17 1341  (1.702 m)     Pain Score 08/06/17 1337 7   Constitutional: Alert and oriented. Well appearing and in no acute distress. Eyes: Conjunctivae are normal.  Head: Atraumatic. Nose: No congestion/rhinnorhea. Mouth/Throat: Mucous membranes are moist.  Neck: No stridor.   Cardiovascular: Tachycardia. Good peripheral circulation. Grossly normal heart sounds.   Respiratory: Normal respiratory effort.  No retractions. Lungs CTAB. Gastrointestinal: Soft with focal LLQ tenderness. No rebound or guarding.  Musculoskeletal: No lower extremity tenderness nor edema. No gross deformities  of extremities. Neurologic:  Normal speech and language. No gross focal neurologic deficits are appreciated.  Skin:  Skin is warm, dry and intact. Healing lesion on posterior neck.    ____________________________________________   LABS (all labs ordered are listed, but only abnormal results are displayed)  Labs Reviewed  COMPREHENSIVE METABOLIC PANEL - Abnormal; Notable for the following:       Result Value    Potassium 2.8 (*)    Glucose, Bld 110 (*)    Total Protein 8.2 (*)    All other components within normal limits  CBC WITH DIFFERENTIAL/PLATELET - Abnormal; Notable for the following:    Platelets 489 (*)    All other components within normal limits  URINALYSIS, ROUTINE W REFLEX MICROSCOPIC - Abnormal; Notable for the following:    APPearance CLOUDY (*)    Bilirubin Urine SMALL (*)    Ketones, ur 40 (*)    All other components within normal limits  LIPASE, BLOOD  PREGNANCY, URINE   ____________________________________________  RADIOLOGY  Ct Abdomen Pelvis W Contrast  Result Date: 08/06/2017 CLINICAL DATA:  41 year old female with lower abdominal pain and vomiting for 3 days with bloody emesis. Superficial MRSA infection of the neck recently diagnosed. EXAM: CT ABDOMEN AND PELVIS WITH CONTRAST TECHNIQUE: Multidetector CT imaging of the abdomen and pelvis was performed using the standard protocol following bolus administration of intravenous contrast. CONTRAST:  ISOVUE-300 IOPAMIDOL (ISOVUE-300) INJECTION 61% COMPARISON:  CT Abdomen and Pelvis 07/01/2013. FINDINGS: Lower chest: Normal lung bases.  No pericardial or pleural effusion. Hepatobiliary: Negative liver and gallbladder. Pancreas: Negative. Spleen: Negative. Adrenals/Urinary Tract: Normal adrenal glands. Bilateral renal enhancement and contrast excretion is normal. 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 Negative course of both ureters. Unremarkable urinary bladder. Stomach/Bowel: Stool ball in the rectum. Decompressed sigmoid colon with appearance of mild wall thickening. Diverticulosis in the proximal sigmoid. No mesenteric inflammation. Occasional diverticula in the descending colon. Retained stool in the transverse colon and at both flexures. Negative right colon and appendix. Negative terminal ileum. No dilated small bowel. Negative stomach and duodenum. No abdominal free fluid.  No free air.  Vascular/Lymphatic: Major arterial structures appear patent and normal. Portal venous system is patent. No lymphadenopathy. Reproductive: Negative. Other: No pelvic free fluid. Musculoskeletal: Lower lumbar facet degeneration and L4-L5 disc degeneration with vacuum disc. No acute osseous abnormality identified. IMPRESSION: 1. Mild wall thickening in the sigmoid colon could be artifact due to underdistention but mild acute diverticulitis or colitis is difficult to exclude. 2. Otherwise no acute or inflammatory process in the abdomen or pelvis. 3. 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 Electronically Signed   By: Odessa Fleming M.D.   On: 08/06/2017 16:47    ____________________________________________   PROCEDURES  Procedure(s) performed:   Procedures  None ____________________________________________   INITIAL IMPRESSION / ASSESSMENT AND PLAN / ED COURSE  Pertinent labs & imaging results that were available during my care of the patient were reviewed by me and considered in my medical decision making (see chart for details).  Patient presents to the emergency department for evaluation of worsening vomiting over the past several days after starting doxycycline. Patient has focal right lower quadrant tenderness to palpation with no rebound or guarding. Plan for labs, CT to rule out appendicitis, IV fluids, and phenergan. She initially expressed in triage that she was having bloody emesis but on further evaluation she describes it as pain can sometimes yellow. No frank blood.  CT with possible mild diverticulitis. Patient is focally tender in this area. Plan to start abx and discharge home with supportive care plan. Patient is feeling better and tolerating PO.   At this time, I do not feel there is any life-threatening condition present. I have reviewed and discussed all results (EKG, imaging, lab, urine as appropriate), exam findings with patient. I have  reviewed nursing notes and appropriate previous records.  I feel the patient is safe to be discharged home without further emergent workup. Discussed usual and customary return precautions. Patient and family (if present) verbalize understanding and are comfortable with this plan.  Patient will follow-up with their primary care provider. If they do not have a primary care provider, information for follow-up has been provided to them. All questions have been answered.  ____________________________________________  FINAL CLINICAL IMPRESSION(S) / ED DIAGNOSES  Final diagnoses:  Colitis  Non-intractable vomiting with nausea, unspecified vomiting type     MEDICATIONS GIVEN DURING THIS VISIT:  Medications  sodium chloride 0.9 % bolus 1,000 mL (0 mLs Intravenous Stopped 08/06/17 1713)  promethazine (PHENERGAN) injection 12.5 mg (12.5 mg Intravenous Given 08/06/17 1520)  iopamidol (ISOVUE-300) 61 % injection 100 mL (100 mLs Intravenous Contrast Given 08/06/17 1629)     NEW OUTPATIENT MEDICATIONS STARTED DURING THIS VISIT:  Discharge Medication List as of 08/06/2017  5:14 PM    START taking these medications   Details  ciprofloxacin (CIPRO) 500 MG tablet Take 1 tablet (500 mg total) by mouth 2 (two) times daily., Starting Wed 08/06/2017, Print    dicyclomine (BENTYL) 20 MG tablet Take 1 tablet (20 mg total) by mouth 2 (two) times daily., Starting Wed 08/06/2017, Print    metroNIDAZOLE (FLAGYL) 500 MG tablet Take 1 tablet (500 mg total) by mouth 2 (two) times daily., Starting Wed 08/06/2017, Print    promethazine (PHENERGAN) 25 MG tablet Take 1 tablet (25 mg total) by mouth every 6 (six) hours as needed for nausea or vomiting., Starting Wed 08/06/2017, Print    traMADol (ULTRAM) 50 MG tablet Take 1 tablet (50 mg total) by mouth every 6 (six) hours as needed., Starting Wed 08/06/2017, Print        Note:  This document was prepared using Dragon voice recognition software and may include  unintentional dictation errors.  Alona Bene, MD Emergency Medicine    Mykaila Blunck, Arlyss Repress, MD 08/07/17 1346

## 2017-08-07 NOTE — Telephone Encounter (Signed)
Called VM full unable to leave results.

## 2017-08-08 ENCOUNTER — Encounter (HOSPITAL_BASED_OUTPATIENT_CLINIC_OR_DEPARTMENT_OTHER): Payer: Self-pay | Admitting: *Deleted

## 2017-08-08 ENCOUNTER — Emergency Department (HOSPITAL_BASED_OUTPATIENT_CLINIC_OR_DEPARTMENT_OTHER)
Admission: EM | Admit: 2017-08-08 | Discharge: 2017-08-08 | Disposition: A | Discharge status: Home / Self Care | Payer: BLUE CROSS/BLUE SHIELD | Attending: Emergency Medicine | Admitting: Emergency Medicine

## 2017-08-08 ENCOUNTER — Emergency Department (HOSPITAL_BASED_OUTPATIENT_CLINIC_OR_DEPARTMENT_OTHER): Payer: BLUE CROSS/BLUE SHIELD

## 2017-08-08 DIAGNOSIS — E876 Hypokalemia: Secondary | ICD-10-CM | POA: Insufficient documentation

## 2017-08-08 DIAGNOSIS — Z79899 Other long term (current) drug therapy: Secondary | ICD-10-CM | POA: Diagnosis not present

## 2017-08-08 DIAGNOSIS — R197 Diarrhea, unspecified: Secondary | ICD-10-CM | POA: Insufficient documentation

## 2017-08-08 DIAGNOSIS — Z87891 Personal history of nicotine dependence: Secondary | ICD-10-CM | POA: Insufficient documentation

## 2017-08-08 DIAGNOSIS — R1110 Vomiting, unspecified: Secondary | ICD-10-CM | POA: Diagnosis present

## 2017-08-08 LAB — URINALYSIS, ROUTINE W REFLEX MICROSCOPIC
BILIRUBIN URINE: NEGATIVE
Glucose, UA: NEGATIVE mg/dL
HGB URINE DIPSTICK: NEGATIVE
Ketones, ur: NEGATIVE mg/dL
Leukocytes, UA: NEGATIVE
Nitrite: NEGATIVE
PH: 5.5 (ref 5.0–8.0)
Protein, ur: NEGATIVE mg/dL
SPECIFIC GRAVITY, URINE: 1.025 (ref 1.005–1.030)

## 2017-08-08 LAB — COMPREHENSIVE METABOLIC PANEL
ALBUMIN: 4.5 g/dL (ref 3.5–5.0)
ALT: 16 U/L (ref 14–54)
AST: 19 U/L (ref 15–41)
Alkaline Phosphatase: 53 U/L (ref 38–126)
Anion gap: 8 (ref 5–15)
BILIRUBIN TOTAL: 0.2 mg/dL — AB (ref 0.3–1.2)
BUN: 13 mg/dL (ref 6–20)
CHLORIDE: 104 mmol/L (ref 101–111)
CO2: 27 mmol/L (ref 22–32)
CREATININE: 1.03 mg/dL — AB (ref 0.44–1.00)
Calcium: 9.6 mg/dL (ref 8.9–10.3)
GFR calc Af Amer: >60 mL/min (ref >60)
GLUCOSE: 92 mg/dL (ref 65–99)
Potassium: 2.9 mmol/L — ABNORMAL LOW (ref 3.5–5.1)
Sodium: 139 mmol/L (ref 135–145)
Total Protein: 7.4 g/dL (ref 6.5–8.1)

## 2017-08-08 LAB — CBC WITH DIFFERENTIAL/PLATELET
Basophils Absolute: 0 10*3/uL (ref 0.0–0.1)
Basophils Relative: 0 %
EOS ABS: 0 10*3/uL (ref 0.0–0.7)
EOS PCT: 0 %
HCT: 38.4 % (ref 36.0–46.0)
Hemoglobin: 13.3 g/dL (ref 12.0–15.0)
LYMPHS ABS: 3.8 10*3/uL (ref 0.7–4.0)
Lymphocytes Relative: 33 %
MCH: 29 pg (ref 26.0–34.0)
MCHC: 34.6 g/dL (ref 30.0–36.0)
MCV: 83.8 fL (ref 78.0–100.0)
MONO ABS: 0.9 10*3/uL (ref 0.1–1.0)
MONOS PCT: 7 %
Neutro Abs: 6.9 10*3/uL (ref 1.7–7.7)
Neutrophils Relative %: 60 %
PLATELETS: 425 10*3/uL — AB (ref 150–400)
RBC: 4.58 MIL/uL (ref 3.87–5.11)
RDW: 13.3 % (ref 11.5–15.5)
WBC: 11.6 10*3/uL — AB (ref 4.0–10.5)

## 2017-08-08 LAB — LIPASE, BLOOD: LIPASE: 36 U/L (ref 11–51)

## 2017-08-08 LAB — PREGNANCY, URINE: Preg Test, Ur: NEGATIVE

## 2017-08-08 MED ORDER — SODIUM CHLORIDE 0.9 % IV BOLUS (SEPSIS)
1000.0000 mL | Freq: Once | INTRAVENOUS | Status: AC
  Administered 2017-08-08: 1000 mL via INTRAVENOUS

## 2017-08-08 MED ORDER — FENTANYL CITRATE (PF) 100 MCG/2ML IJ SOLN
50.0000 ug | Freq: Once | INTRAMUSCULAR | Status: AC
  Administered 2017-08-08: 50 ug via INTRAVENOUS
  Filled 2017-08-08: qty 2

## 2017-08-08 MED ORDER — MAGNESIUM SULFATE 2 GM/50ML IV SOLN
2.0000 g | Freq: Once | INTRAVENOUS | Status: AC
  Administered 2017-08-08: 2 g via INTRAVENOUS
  Filled 2017-08-08: qty 50

## 2017-08-08 MED ORDER — PROMETHAZINE HCL 25 MG RE SUPP: 25.0000 mg | Freq: Four times a day (QID) | RECTAL | 0 refills | Status: DC | PRN

## 2017-08-08 MED ORDER — POTASSIUM CHLORIDE CRYS ER 20 MEQ PO TBCR: 20.0000 meq | EXTENDED_RELEASE_TABLET | Freq: Every day | ORAL | 0 refills | Status: DC

## 2017-08-08 MED ORDER — ONDANSETRON HCL 4 MG/2ML IJ SOLN
4.0000 mg | Freq: Once | INTRAMUSCULAR | Status: AC
  Administered 2017-08-08: 4 mg via INTRAVENOUS
  Filled 2017-08-08: qty 2

## 2017-08-08 MED ORDER — ONDANSETRON 4 MG PO TBDP: 4.0000 mg | ORAL_TABLET | Freq: Three times a day (TID) | ORAL | 0 refills | Status: DC | PRN

## 2017-08-08 MED ORDER — POTASSIUM CHLORIDE 10 MEQ/100ML IV SOLN
10.0000 meq | INTRAVENOUS | Status: AC
  Administered 2017-08-08 (×2): 10 meq via INTRAVENOUS
  Filled 2017-08-08 (×2): qty 100

## 2017-08-08 MED ORDER — PROMETHAZINE HCL 25 MG PO TABS: 25.0000 mg | ORAL_TABLET | Freq: Four times a day (QID) | ORAL | 0 refills | Status: DC | PRN

## 2017-08-08 MED ORDER — HYDROCODONE-ACETAMINOPHEN 5-325 MG PO TABS: 1.0000 | ORAL_TABLET | Freq: Four times a day (QID) | ORAL | 0 refills | Status: DC | PRN

## 2017-08-08 NOTE — Telephone Encounter (Signed)
Callback: Patient given WCX result. She reports she is in the ER currently with diverticulitis.

## 2017-08-08 NOTE — Discharge Instructions (Addendum)

## 2017-08-08 NOTE — ED Notes (Signed)
Pt provided ginger ale to drink per EDP. Tolerates well, no n/v

## 2017-08-08 NOTE — ED Triage Notes (Signed)
Pt presents with n/v and abd pain x5days (was seen for same 2 days ago). Reports dx of diverticulitis and states she's unable to take prescribed medications d/t n/v.

## 2017-08-08 NOTE — ED Notes (Signed)
Potassium rate changed due to sensation.

## 2017-08-08 NOTE — ED Notes (Signed)
ED Provider at bedside. 

## 2017-08-08 NOTE — ED Provider Notes (Signed)
[facility]   CSN: [id number] Arrival date & time: 08/08/17  1455     History   Chief Complaint Chief Complaint  Patient presents with  . Emesis    HPI Brandi Welch is a 41 y.o. female.He presents to the emergency department with chief complaint She was seen in the emergency department 2 days ago diagnosed with mild sigmoid colitis. She has had 1 month of progressively worsening she states that her vomiting occurs every time she tries to drink. He has been unable to hold down any of her antibiotics. She states she is having some diarrhea after her visit here 2 days ago. She states that she feels worse and feels more dehydrated and dizzy when she stands. She denies fevers. She complains of only pain in her lower abdomen. She denies urinary symptoms. She states that she did notice some blood in her vomitus. She has an established relationship with her gastroenterologist [facility] because she had a previous episode of vomiting and nausea that was persistent however she states it was not as bad as it is now. He denies marijuana abuse, history of abdominal surgeries, opiate use, chronic constipation.  HPI  Past Medical History:  Diagnosis Date  . Anxiety   . Arthritis   . Fibromyalgia   . IC (interstitial cystitis)   . Knee pain, chronic   . Ulcer     There are no active problems to display for this patient.   Past Surgical History:  Procedure Laterality Date  . CERVICAL BIOPSY  W/ LOOP ELECTRODE EXCISION    . NASAL SEPTUM SURGERY    . NOSE SURGERY      OB History    No data available       Home Medications    Prior to Admission medications   Medication Sig Start Date End Date Taking? Authorizing Provider  acyclovir (ZOVIRAX) 200 MG capsule Take 200 mg by mouth 2 (two) times daily.   Yes [provider]  gabapentin (NEURONTIN) 300 MG capsule Take 300 mg by mouth 4 (four) times daily.   Yes [provider]  lithium carbonate 300 MG capsule Take 300 mg by mouth 2 (two) times daily with a meal.   Yes [provider]  vitamin B-12 (CYANOCOBALAMIN) 500 MCG tablet Take 300 mcg by mouth daily.   Yes [provider]  Vitamin D, Ergocalciferol, (DRISDOL) 50000 UNITS CAPS capsule Take 8,000 Units by mouth every 7 (seven) days.   Yes [provider]  ciprofloxacin (CIPRO) 500 MG tablet Take 1 tablet (500 mg total) by mouth 2 (two) times daily. 08/06/17   Long, Arlyss Repress, MD  dicyclomine (BENTYL) 20 MG tablet Take 1 tablet (20 mg total) by mouth 2 (two) times daily. 08/06/17   Long, Arlyss Repress, MD  doxycycline (VIBRAMYCIN) 100 MG capsule Take 1 capsule (100 mg total) by mouth 2 (two) times daily. 08/03/17   Collene Gobble, MD  metroNIDAZOLE (FLAGYL) 500 MG tablet Take 1 tablet (500 mg total) by mouth 2 (two) times daily. 08/06/17   Long, Arlyss Repress, MD  mupirocin cream (BACTROBAN) 2 % Apply 1 application topically 2 (two) times daily. 08/03/17   Collene Gobble, MD  ondansetron (ZOFRAN ODT) 4 MG disintegrating tablet Take 1 tablet (4 mg total) by mouth every 8 (eight) hours as needed for nausea. 05/09/16   Garlon Hatchet, PA-C  promethazine (PHENERGAN) 25 MG tablet Take 1 tablet (25 mg total) by  mouth every 6 (six) hours as needed for nausea or vomiting. 08/06/17   Long, Arlyss Repress, MD  traMADol (ULTRAM) 50 MG tablet Take 1 tablet (50 mg total) by mouth every 6 (six) hours as needed. 08/06/17   Long, Arlyss Repress, MD    Family History No family history on file.  Social History Social History  Substance Use Topics  . Smoking status: Former Games developer  . Smokeless tobacco: Never Used  . Alcohol use No     Allergies   Shellfish allergy and Naloxone   Review of Systems Review of Systems Ten systems reviewed and are negative for acute change, except as noted in the HPI.    Physical Exam Updated Vital Signs BP 117/88 (BP Location: Right Arm)   Pulse 80   Temp 98.3 F (36.8 C) (Oral)    Resp 16   Ht  (1.702 m)   Wt 102.1 kg (225 lb)   LMP 07/25/2017 (Exact Date)   SpO2 95%   BMI 35.24 kg/m   Physical Exam Physical Exam  Nursing note and vitals reviewed. Constitutional: She is oriented to person, place, and time. She appears well-developed and well-nourished. No distress.  HENT:  Head: Normocephalic and atraumatic.  Eyes: Conjunctivae normal and EOM are normal. Pupils are equal, round, and reactive to light. No scleral icterus.  Neck: Normal range of motion.  Cardiovascular: Normal rate, regular rhythm and normal heart sounds.  Exam reveals no gallop and no friction rub.   No murmur heard. Pulmonary/Chest: Effort normal and breath sounds normal. No respiratory distress.  Abdominal: Soft. Bowel sounds are normal. She exhibits no distension and no mass. There is mild diffuse non-focal tenderness. There is no guarding.  Neurological: She is alert and oriented to person, place, and time.  Skin: Skin is warm and dry. She is not diaphoretic.    ED Treatments / Results  Labs (all labs ordered are listed, but only abnormal results are displayed) Labs Reviewed  COMPREHENSIVE METABOLIC PANEL - Abnormal; Notable for the following:       Result Value   Potassium 2.9 (*)    Creatinine, Ser 1.03 (*)    Total Bilirubin 0.2 (*)    All other components within normal limits  CBC WITH DIFFERENTIAL/PLATELET - Abnormal; Notable for the following:    WBC 11.6 (*)    Platelets 425 (*)    All other components within normal limits  URINALYSIS, ROUTINE W REFLEX MICROSCOPIC  PREGNANCY, URINE  LIPASE, BLOOD    EKG  EKG Interpretation None       Radiology Dg Abd Acute W/chest  Result Date: 08/08/2017 CLINICAL DATA:  Intractable vomiting. EXAM: DG ABDOMEN ACUTE W/ 1V CHEST COMPARISON:  CT of the abdomen and pelvis 08/06/2017. FINDINGS: There is no evidence of dilated bowel loops or free intraperitoneal air. No radiopaque calculi or other significant radiographic  abnormality is seen. Heart size and mediastinal contours are within normal limits. Both lungs are clear. IMPRESSION: Negative abdominal radiographs.  No acute cardiopulmonary disease. Electronically Signed   By: Marin Roberts M.D.   On: 08/08/2017 17:55    Procedures Procedures (including critical care time)  Medications Ordered in ED Medications  potassium chloride 10 mEq in 100 mL IVPB (10 mEq Intravenous New Bag/Given 08/08/17 1741)  magnesium sulfate IVPB 2 g 50 mL (not administered)  sodium chloride 0.9 % bolus 1,000 mL (1,000 mLs Intravenous New Bag/Given 08/08/17 1711)  ondansetron (ZOFRAN) injection 4 mg (4 mg Intravenous Given 08/08/17 1711)  fentaNYL (SUBLIMAZE) injection 50 mcg (50 mcg Intravenous Given 08/08/17 1742)     Initial Impression / Assessment and Plan / ED Course  I have reviewed the triage vital signs and the nursing notes.  Pertinent labs & imaging results that were available during my care of the patient were reviewed by me and considered in my medical decision making (see chart for details).    Patient without intractable vomiting. Her potassium has been repleted. Her nausea is improved. She still complaining of abdominal pain. Patient has both PCP and gastroenterology follow-up. If advised her to follow closely with her gastroenterologist. I have reviewed the patient on the [place] drug reporting systemic and there are no active prescriptions at this time. Discharged with pain control and nausea medications as well as potassium. She appears appropriate for discharge at this time is feeling much improved. Final Clinical Impressions(s) / ED Diagnoses   Final diagnoses:  Vomiting and diarrhea  Hypokalemia    New Prescriptions New Prescriptions   No medications on file     Arthor Captain, PA-C 08/10/17 1653    Charlynne Pander, MD 08/10/17 [phone]

## 2017-08-08 NOTE — ED Notes (Signed)
Pt ambulatory to bathroom without assistance, states she feels better

## 2017-10-08 ENCOUNTER — Encounter (HOSPITAL_COMMUNITY): Payer: Self-pay

## 2017-10-08 ENCOUNTER — Emergency Department (HOSPITAL_COMMUNITY): Payer: BLUE CROSS/BLUE SHIELD

## 2017-10-08 ENCOUNTER — Other Ambulatory Visit: Payer: Self-pay

## 2017-10-08 ENCOUNTER — Emergency Department (HOSPITAL_COMMUNITY)
Admission: EM | Admit: 2017-10-08 | Discharge: 2017-10-08 | Disposition: A | Discharge status: Home / Self Care | Payer: BLUE CROSS/BLUE SHIELD | Attending: Emergency Medicine | Admitting: Emergency Medicine

## 2017-10-08 DIAGNOSIS — F329 Major depressive disorder, single episode, unspecified: Secondary | ICD-10-CM | POA: Insufficient documentation

## 2017-10-08 DIAGNOSIS — Z79899 Other long term (current) drug therapy: Secondary | ICD-10-CM | POA: Diagnosis not present

## 2017-10-08 DIAGNOSIS — F32A Depression, unspecified: Secondary | ICD-10-CM | POA: Diagnosis present

## 2017-10-08 DIAGNOSIS — T50901A Poisoning by unspecified drugs, medicaments and biological substances, accidental (unintentional), initial encounter: Secondary | ICD-10-CM | POA: Diagnosis present

## 2017-10-08 DIAGNOSIS — Z87891 Personal history of nicotine dependence: Secondary | ICD-10-CM | POA: Insufficient documentation

## 2017-10-08 DIAGNOSIS — F1494 Cocaine use, unspecified with cocaine-induced mood disorder: Secondary | ICD-10-CM | POA: Diagnosis not present

## 2017-10-08 DIAGNOSIS — Z9189 Other specified personal risk factors, not elsewhere classified: Secondary | ICD-10-CM | POA: Diagnosis present

## 2017-10-08 LAB — COMPREHENSIVE METABOLIC PANEL
ALBUMIN: 4.7 g/dL (ref 3.5–5.0)
ALK PHOS: 58 U/L (ref 38–126)
ALT: 22 U/L (ref 14–54)
ANION GAP: 11 (ref 5–15)
AST: 30 U/L (ref 15–41)
BUN: 17 mg/dL (ref 6–20)
CALCIUM: 9.4 mg/dL (ref 8.9–10.3)
CO2: 21 mmol/L — AB (ref 22–32)
Chloride: 103 mmol/L (ref 101–111)
Creatinine, Ser: 1.22 mg/dL — ABNORMAL HIGH (ref 0.44–1.00)
GFR calc Af Amer: >60 mL/min (ref >60)
GFR calc non Af Amer: 54 mL/min — ABNORMAL LOW (ref >60)
GLUCOSE: 339 mg/dL — AB (ref 65–99)
Potassium: 3.7 mmol/L (ref 3.5–5.1)
SODIUM: 135 mmol/L (ref 135–145)
Total Bilirubin: 0.3 mg/dL (ref 0.3–1.2)
Total Protein: 8 g/dL (ref 6.5–8.1)

## 2017-10-08 LAB — PATHOLOGIST SMEAR REVIEW

## 2017-10-08 LAB — SALICYLATE LEVEL: Salicylate Lvl: <7 mg/dL (ref 2.8–30.0)

## 2017-10-08 LAB — CBC WITH DIFFERENTIAL/PLATELET
Basophils Absolute: 0 10*3/uL (ref 0.0–0.1)
Basophils Relative: 0 %
EOS ABS: 0 10*3/uL (ref 0.0–0.7)
Eosinophils Relative: 0 %
HCT: 39.2 % (ref 36.0–46.0)
HEMOGLOBIN: 12.9 g/dL (ref 12.0–15.0)
LYMPHS ABS: 11.7 10*3/uL — AB (ref 0.7–4.0)
LYMPHS PCT: 52 %
MCH: 29.3 pg (ref 26.0–34.0)
MCHC: 32.9 g/dL (ref 30.0–36.0)
MCV: 89.1 fL (ref 78.0–100.0)
Monocytes Absolute: 1.6 10*3/uL — ABNORMAL HIGH (ref 0.1–1.0)
Monocytes Relative: 7 %
NEUTROS ABS: 9.3 10*3/uL — AB (ref 1.7–7.7)
Neutrophils Relative %: 41 %
Platelets: 423 10*3/uL — ABNORMAL HIGH (ref 150–400)
RBC: 4.4 MIL/uL (ref 3.87–5.11)
RDW: 13.8 % (ref 11.5–15.5)
WBC: 22.6 10*3/uL — ABNORMAL HIGH (ref 4.0–10.5)

## 2017-10-08 LAB — I-STAT BETA HCG BLOOD, ED (MC, WL, AP ONLY)

## 2017-10-08 LAB — RAPID URINE DRUG SCREEN, HOSP PERFORMED
AMPHETAMINES: NOT DETECTED
Barbiturates: NOT DETECTED
Benzodiazepines: POSITIVE — AB
Cocaine: POSITIVE — AB
Opiates: POSITIVE — AB
Tetrahydrocannabinol: NOT DETECTED

## 2017-10-08 LAB — ETHANOL: Alcohol, Ethyl (B): <10 mg/dL (ref <10)

## 2017-10-08 LAB — ACETAMINOPHEN LEVEL: Acetaminophen (Tylenol), Serum: <10 ug/mL — ABNORMAL LOW (ref 10–30)

## 2017-10-08 MED ORDER — ONDANSETRON HCL 4 MG/2ML IJ SOLN
INTRAMUSCULAR | Status: AC
  Administered 2017-10-08: 4 mg via INTRAVENOUS
  Filled 2017-10-08: qty 2

## 2017-10-08 MED ORDER — NALOXONE HCL 2 MG/2ML IJ SOSY
2.0000 mg | PREFILLED_SYRINGE | Freq: Once | INTRAMUSCULAR | Status: AC
  Administered 2017-10-08: 2 mg via INTRAVENOUS

## 2017-10-08 MED ORDER — ONDANSETRON HCL 4 MG/2ML IJ SOLN
4.0000 mg | Freq: Once | INTRAMUSCULAR | Status: AC
  Administered 2017-10-08: 4 mg via INTRAVENOUS

## 2017-10-08 MED ORDER — SODIUM CHLORIDE 0.9 % IV BOLUS (SEPSIS)
1000.0000 mL | Freq: Once | INTRAVENOUS | Status: AC
  Administered 2017-10-08: 1000 mL via INTRAVENOUS

## 2017-10-08 MED ORDER — LIP MEDEX EX OINT
TOPICAL_OINTMENT | Freq: Once | CUTANEOUS | Status: AC
  Administered 2017-10-08: 10:00:00 via TOPICAL
  Filled 2017-10-08 (×2): qty 7

## 2017-10-08 MED ORDER — HYDROXYZINE HCL 25 MG PO TABS: 25.0000 mg | ORAL_TABLET | Freq: Three times a day (TID) | ORAL | Status: DC | PRN

## 2017-10-08 NOTE — BH Assessment (Signed)
[facility]  Per Juanetta BeetsJacqueline Norman, DO, this pt would benefit from psychiatric hospitalization, but does not meet criteria for IVC at this time.  Malva LimesLinsey Strader, RN, [facility], [id number].  This Clinical research associatewriter spoke to pt to discuss disposition and have her sign consents, but [facility].  Laveda AbbeLaurie Britton Parks, FNP concurs with this decision.  [facility] with [facility] to continue treatment with her unnamed outpatient behavioral health providers.  This has been included in pt's discharge instructions.  Pt's nurse, Whitney PostLogan, has been notified, as has Linsey.  Doylene Canninghomas Klohe Lovering, MA Triage Specialist [phone]

## 2017-10-08 NOTE — ED Notes (Signed)
Patient incontinent of urine on arrival-patient up to side of bed-provided cleanser, washcloths and towels-patient cleaned self and bed linen changed. Patient states she wants detox from percocet and Valium.

## 2017-10-08 NOTE — Discharge Instructions (Signed)
For your behavioral health needs, you are advised to continue treatment with your regular outpatient providers. °

## 2017-10-08 NOTE — ED Triage Notes (Signed)
Patient arrived by POV cyanotic with snoring respirations-placed on stretcher-patient has a pulse-bagging with 100% NRB-RT at bedside/Dr. Bebe ShaggyWickline at bedside-PIV established and Narcan 2 mg IV administered. Patient then awakened/agitated/thrashing in bed and vomiting large amounts undigested food. Airway cleared-patient then awake enough to spit out large chunks of undigested food.

## 2017-10-08 NOTE — ED Provider Notes (Signed)
[facility]-EMERGENCY DEPT Provider Note   CSN: [id number] Arrival date & time: 10/08/17  0405     History   Chief Complaint -overdose Level 5 caveat due to acuity of condition   HPI Brandi Welch is a 41 y.o. female.  The history is provided by a friend. The history is limited by the condition of the patient.  Ingestion  This is a new problem. Episode onset: Just prior to arrival. The problem occurs constantly. The problem has been rapidly worsening. Nothing aggravates the symptoms. Nothing relieves the symptoms.  Patient arrives to the emergency department via private vehicle I was asked to evaluate patient issue as she was being wheeled down the hallway on a stretcher, cyanotic and near apneic Limited history noted on arrival however there was concern for overdose Patient was placed in resuscitation room immediately upon arrival  Past Medical History:  Diagnosis Date  . Anxiety   . Arthritis   . Fibromyalgia   . IC (interstitial cystitis)   . Knee pain, chronic   . Ulcer     There are no active problems to display for this patient.   Past Surgical History:  Procedure Laterality Date  . CERVICAL BIOPSY  W/ LOOP ELECTRODE EXCISION    . NASAL SEPTUM SURGERY    . NOSE SURGERY      OB History    No data available       Home Medications    Prior to Admission medications   Medication Sig Start Date End Date Taking? Authorizing Provider  acyclovir (ZOVIRAX) 200 MG capsule Take 200 mg by mouth 2 (two) times daily.    [provider]  ciprofloxacin (CIPRO) 500 MG tablet Take 1 tablet (500 mg total) by mouth 2 (two) times daily. 08/06/17   Long, Arlyss RepressJoshua G, MD  dicyclomine (BENTYL) 20 MG tablet Take 1 tablet (20 mg total) by mouth 2 (two) times daily. 08/06/17   Long, Arlyss RepressJoshua G, MD  doxycycline (VIBRAMYCIN) 100 MG capsule Take 1 capsule (100 mg total) by mouth 2 (two) times daily. 08/03/17   Collene Gobbleaub, Steven A, MD  gabapentin (NEURONTIN)  300 MG capsule Take 300 mg by mouth 4 (four) times daily.    [provider]  HYDROcodone-acetaminophen (NORCO) 5-325 MG tablet Take 1 tablet by mouth every 6 (six) hours as needed. 08/08/17   Arthor CaptainHarris, Abigail, PA-C  lithium carbonate 300 MG capsule Take 300 mg by mouth 2 (two) times daily with a meal.    [provider]  metroNIDAZOLE (FLAGYL) 500 MG tablet Take 1 tablet (500 mg total) by mouth 2 (two) times daily. 08/06/17   Long, Arlyss RepressJoshua G, MD  mupirocin cream (BACTROBAN) 2 % Apply 1 application topically 2 (two) times daily. 08/03/17   Collene Gobbleaub, Steven A, MD  ondansetron (ZOFRAN ODT) 4 MG disintegrating tablet Take 1 tablet (4 mg total) by mouth every 8 (eight) hours as needed for nausea. 08/08/17   Harris, Cammy CopaAbigail, PA-C  potassium chloride SA (K-DUR,KLOR-CON) 20 MEQ tablet Take 1 tablet (20 mEq total) by mouth daily. 08/08/17   Arthor CaptainHarris, Abigail, PA-C  promethazine (PHENERGAN) 25 MG suppository Place 1 suppository (25 mg total) rectally every 6 (six) hours as needed for nausea or vomiting. 08/08/17   Arthor CaptainHarris, Abigail, PA-C  promethazine (PHENERGAN) 25 MG tablet Take 1 tablet (25 mg total) by mouth every 6 (six) hours as needed for nausea or vomiting. 08/08/17   Harris, Cammy CopaAbigail, PA-C  traMADol (ULTRAM) 50 MG tablet Take 1 tablet (50  mg total) by mouth every 6 (six) hours as needed. 08/06/17   Long, Arlyss Repress, MD  vitamin B-12 (CYANOCOBALAMIN) 500 MCG tablet Take 300 mcg by mouth daily.    [provider]  Vitamin D, Ergocalciferol, (DRISDOL) 50000 UNITS CAPS capsule Take 8,000 Units by mouth every 7 (seven) days.    [provider]    Family History No family history on file.  Social History Social History   Tobacco Use  . Smoking status: Former Games developer  . Smokeless tobacco: Never Used  Substance Use Topics  . Alcohol use: No  . Drug use: No     Allergies   Shellfish allergy and Naloxone   Review of Systems Review of Systems  Unable to perform ROS: Acuity of  condition     Physical Exam Updated Vital Signs BP (!) 136/94   Pulse 93   Temp 97.9 F (36.6 C) (Oral)   Resp 16   LMP  (LMP Unknown)   SpO2 98%   Physical Exam CONSTITUTIONAL: Ill-appearing, cyanotic, near apneic HEAD: Normocephalic/atraumatic EYES: Pupils equal and pinpoint ENMT: Mucous membranes moist NECK: supple no meningeal signs CV: S1/S2 noted, no murmurs/rubs/gallops noted LUNGS: Decreased breath sounds bilaterally, spontaneous respirations noted ABDOMEN: soft NEURO: Pt is unresponsive to voice and pain EXTREMITIES: pulses normal/equal, full ROM SKIN: Cyanotic PSYCH: Unable to assess  ED Treatments / Results  Labs (all labs ordered are listed, but only abnormal results are displayed) Labs Reviewed  COMPREHENSIVE METABOLIC PANEL - Abnormal; Notable for the following components:      Result Value   CO2 21 (*)    Glucose, Bld 339 (*)    Creatinine, Ser 1.22 (*)    GFR calc non Af Amer 54 (*)    All other components within normal limits  CBC WITH DIFFERENTIAL/PLATELET - Abnormal; Notable for the following components:   WBC 22.6 (*)    Platelets 423 (*)    Neutro Abs 9.3 (*)    Lymphs Abs 11.7 (*)    Monocytes Absolute 1.6 (*)    All other components within normal limits  ACETAMINOPHEN LEVEL - Abnormal; Notable for the following components:   Acetaminophen (Tylenol), Serum <10 (*)    All other components within normal limits  ETHANOL  SALICYLATE LEVEL  RAPID URINE DRUG SCREEN, HOSP PERFORMED  PATHOLOGIST SMEAR REVIEW  I-STAT BETA HCG BLOOD, ED ([facility], [facility], [facility] ONLY)    EKG ED ECG REPORT   Date: 10/08/2017 0426am  Rate: 109  Rhythm: sinus tachycardia  QRS Axis: normal  Intervals: normal  ST/T Wave abnormalities: normal  Conduction Disutrbances:none Artifact noted  I have personally reviewed the EKG tracing and agree with the computerized printout as noted.  Radiology Dg Chest Port 1 View  Result Date: 10/08/2017 CLINICAL DATA:  41 y/o  F;  overdose and vomiting. EXAM: PORTABLE CHEST 1 VIEW COMPARISON:  08/08/2017 chest radiograph FINDINGS: Stable heart size and mediastinal contours are within normal limits. Both lungs are clear. The visualized skeletal structures are unremarkable. IMPRESSION: No active disease. Electronically Signed   By: Mitzi Hansen M.D.   On: 10/08/2017 04:43    Procedures Procedures  CRITICAL CARE Performed by: Joya Gaskins Total critical care time: 31 minutes Critical care time was exclusive of separately billable procedures and treating other patients. Critical care was necessary to treat or prevent imminent or life-threatening deterioration. Critical care was time spent personally by me on the following activities: development of treatment plan with patient and/or surrogate as well  as nursing, discussions with consultants, evaluation of patient's response to treatment, examination of patient, obtaining history from patient or surrogate, ordering and performing treatments and interventions, ordering and review of laboratory studies, ordering and review of radiographic studies, pulse oximetry and re-evaluation of patient's condition. Patient with overdose requiring IV Narcan for resuscitation Medications Ordered in ED Medications  lip balm (CARMEX) ointment (not administered)  ondansetron (ZOFRAN) injection 4 mg (4 mg Intravenous Given 10/08/17 0435)  naloxone [facility](NARCAN) injection 2 mg (2 mg Intravenous Given by Other 10/08/17 0405)  sodium chloride 0.9 % bolus 1,000 mL (1,000 mLs Intravenous New Bag/Given 10/08/17 0628)     Initial Impression / Assessment and Plan / ED Course  I have reviewed the triage vital signs and the nursing notes.  Pertinent labs & imaging results that were available during my care of the patient were reviewed by me and considered in my medical decision making (see chart for details).     4:26 AM Patient seen on arrival she was being moved to resuscitation  room Concern for overdose Patient placed on monitor and bag-valve-mask started She was given Narcan and soon after began to wake up She had extensive vomiting however her airway was cleared with suction She is now awake and alert tearful and anxious We will continue to monitor, imaging and labs have been started 6:13 AM Patient awake and alert at this time we will continue to monitor 6:53 AM Patient is awake and alert, but is very tearful She denies IV drug abuse, denies heroin use However she does report taking Percocet and Valium tonight She also reports feeling "mentally unstable" She reports she feels safe at home but does not give a direct answer about suicidal thoughts She reports previous history of suicidal ideation Patient with a very labile mood after receiving Narcan Continue to monitor from a medical standpoint, will also consult psychiatry due to patient feeling "mentally unstable"   Currently vitals are stable, labs are more consistent with a stress response no signs of sepsis  Final Clinical Impressions(s) / ED Diagnoses   Final diagnoses:  Accidental drug overdose, initial encounter    ED Discharge Orders    None       Zadie RhineWickline, Vanda Waskey, MD 10/08/17 [phone]

## 2017-10-08 NOTE — ED Provider Notes (Signed)
1:07 PM Please see psychiatric consultation note for details. Medically cleared by Dr Bebe ShaggyWickline.  I was not personally involved in the care of this patient   Azalia BilisCampos, Kimbley Sprague, MD 10/08/17 1308

## 2017-10-08 NOTE — [facility] Counselor (Signed)
Per L. Arville CareParks, NP, Pt meets inpt criteria.

## 2017-10-08 NOTE — [facility] Suicide Risk Assessment (Signed)
Suicide Risk Assessment  Discharge Assessment   [facility]   Principal Problem: Overdose of drug, accidental or unintentional, initial encounter Discharge Diagnoses:  Patient Active Problem List   Diagnosis Date Noted  . Depression [F32.9] 10/08/2017  . Overdose of drug, accidental or unintentional, initial encounter [T50.901A] 10/08/2017  . 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 Pt stated she could not remember what happened last night but she was not trying to hurt herself. Pt stated she lives at home with her parents and feels safe there. Pt stated she has multiple "pain" related health issues and wants to get into a pain clinic but everyone is dragging their feet. Pt stated she does not overtake her prescribed medications. Pt stated she does not use other substances or drink alcohol. UDS positive for cocaine, BAL negative. Pt was willing to consider inpatient admission but when she could not be completely reassured that her pain would be managed during her stay she declined. Pt has a psychiatrist and a therapist and is followed by Dr Lindley MagnusEskew with neurology. Pt has an appointment with Dr Lindley MagnusEskew on Friday for a consult for bladder surgery next week. Pt stated she will follow up with her outpatient providers. Pt is psychiatrically clear and may continue treatment with medical until ready for discharge.    Total Time spent with patient: 45 minutes  Musculoskeletal: Strength & Muscle Tone: within normal limits Gait & Station: normal Patient leans: N/A  Psychiatric Specialty Exam:   Blood pressure 106/67, pulse 67, temperature 97.9 F (36.6 C), temperature source Oral, resp. rate (!) 9, SpO2 99 %.There is no height or weight on file to calculate BMI.  General Appearance: Casual  Eye Contact::  Good  Speech:  Clear and Coherent409  Volume:  Normal  Mood:  Anxious and Depressed   Affect:  Congruent, Depressed and Tearful  Thought Process:  Coherent, Goal Directed and Linear  Orientation:  Full (Time, Place, and Person)  Thought Content:  Logical  Suicidal Thoughts:  No  Homicidal Thoughts:  No  Memory:  Immediate;   Good Recent;   Good Remote;   Fair  Judgement:  Fair  Insight:  Fair  Psychomotor Activity:  Normal  Concentration:  Good  Recall:  Good  Fund of Knowledge:Good  Language: Good  Akathisia:  No  Handed:  Right  AIMS (if indicated):     Assets:  ArchitectCommunication Skills Financial Resources/Insurance Housing Resilience Social Support Transportation  Sleep:     Cognition: WNL  ADL's:  Intact   Mental Status Per Nursing Assessment::   On Admission:   unintentional drug overdose  Demographic Factors:  Divorced or widowed and Caucasian  Loss Factors: Financial problems/change in socioeconomic status  Historical Factors: Impulsivity  Risk Reduction Factors:   Sense of responsibility to family and Living with another person, especially a relative  Continued Clinical Symptoms:  Depression:   Impulsivity Alcohol/Substance Abuse/Dependencies  Cognitive Features That Contribute To Risk:  Closed-mindedness    Suicide Risk:  Minimal: No identifiable suicidal ideation.  Patients presenting with no risk factors but with morbid ruminations; may be classified as minimal risk based on the severity of the depressive symptoms    Plan Of Care/Follow-up recommendations:  Activity:  as tolerated Diet:  Heart healthy  Laveda AbbeLaurie Britton Zelena Bushong, NP 10/08/2017, 12:58 PM

## 2017-10-08 NOTE — ED Notes (Signed)
Patient given gingerale-no further emesis
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
## 2017-10-21 ENCOUNTER — Ambulatory Visit: Payer: BLUE CROSS/BLUE SHIELD | Admitting: Osteopathic Medicine

## 2017-10-21 DIAGNOSIS — Z0189 Encounter for other specified special examinations: Secondary | ICD-10-CM

## 2017-11-06 DIAGNOSIS — G44099 Other trigeminal autonomic cephalgias (TAC), not intractable: Secondary | ICD-10-CM | POA: Insufficient documentation

## 2018-01-03 DIAGNOSIS — T428X1A Poisoning by antiparkinsonism drugs and other central muscle-tone depressants, accidental (unintentional), initial encounter: Secondary | ICD-10-CM

## 2018-01-03 HISTORY — DX: Poisoning by antiparkinsonism drugs and other central muscle-tone depressants, accidental (unintentional), initial encounter: T42.8X1A

## 2018-05-03 ENCOUNTER — Emergency Department (HOSPITAL_BASED_OUTPATIENT_CLINIC_OR_DEPARTMENT_OTHER)
Admission: EM | Admit: 2018-05-03 | Discharge: 2018-05-03 | Disposition: A | Discharge status: Home / Self Care | Payer: BLUE CROSS/BLUE SHIELD | Attending: Emergency Medicine | Admitting: Emergency Medicine

## 2018-05-03 DIAGNOSIS — R197 Diarrhea, unspecified: Secondary | ICD-10-CM | POA: Insufficient documentation

## 2018-05-03 DIAGNOSIS — R1110 Vomiting, unspecified: Secondary | ICD-10-CM | POA: Diagnosis not present

## 2018-05-03 DIAGNOSIS — Z5321 Procedure and treatment not carried out due to patient leaving prior to being seen by health care provider: Secondary | ICD-10-CM | POA: Insufficient documentation

## 2018-05-03 NOTE — ED Notes (Signed)
Pt not in exam room. Pt left prior to triage. This pt not visualized by this RN.

## 2018-05-03 NOTE — ED Notes (Signed)
Will EMT was getting patients vital signs . Patient stated" Are yall busy". EMT stated that all of our rooms are full at the moment." Patient stated" Oh I feel really bad I need to see the doctor now" EMT told the patient she will have to wait until he signs up for her and was not sure when that would be. Patient stated " Well then im going to leave because I have to see the doctor now I feel bad." EMT stated" That is your choice to leave or stay."

## 2018-05-04 DIAGNOSIS — E785 Hyperlipidemia, unspecified: Secondary | ICD-10-CM | POA: Insufficient documentation

## 2018-05-04 DIAGNOSIS — F1911 Other psychoactive substance abuse, in remission: Secondary | ICD-10-CM | POA: Insufficient documentation

## 2018-05-04 DIAGNOSIS — N179 Acute kidney failure, unspecified: Secondary | ICD-10-CM | POA: Insufficient documentation

## 2018-05-04 DIAGNOSIS — F14929 Cocaine use, unspecified with intoxication, unspecified: Secondary | ICD-10-CM | POA: Insufficient documentation

## 2018-05-04 HISTORY — DX: Cocaine use, unspecified with intoxication, unspecified: F14.929

## 2018-11-03 IMAGING — DX DG CERVICAL SPINE 2 OR 3 VIEWS
4 series · 4 of 4 positions shown · non-contrast
Comparison: Cervical spine CT dated 09/24/2011.

CLINICAL DATA: Right neck open wound extruding pus.  Neck pain.

EXAM:
CERVICAL SPINE - 2-3 VIEW

[c-spine lat]
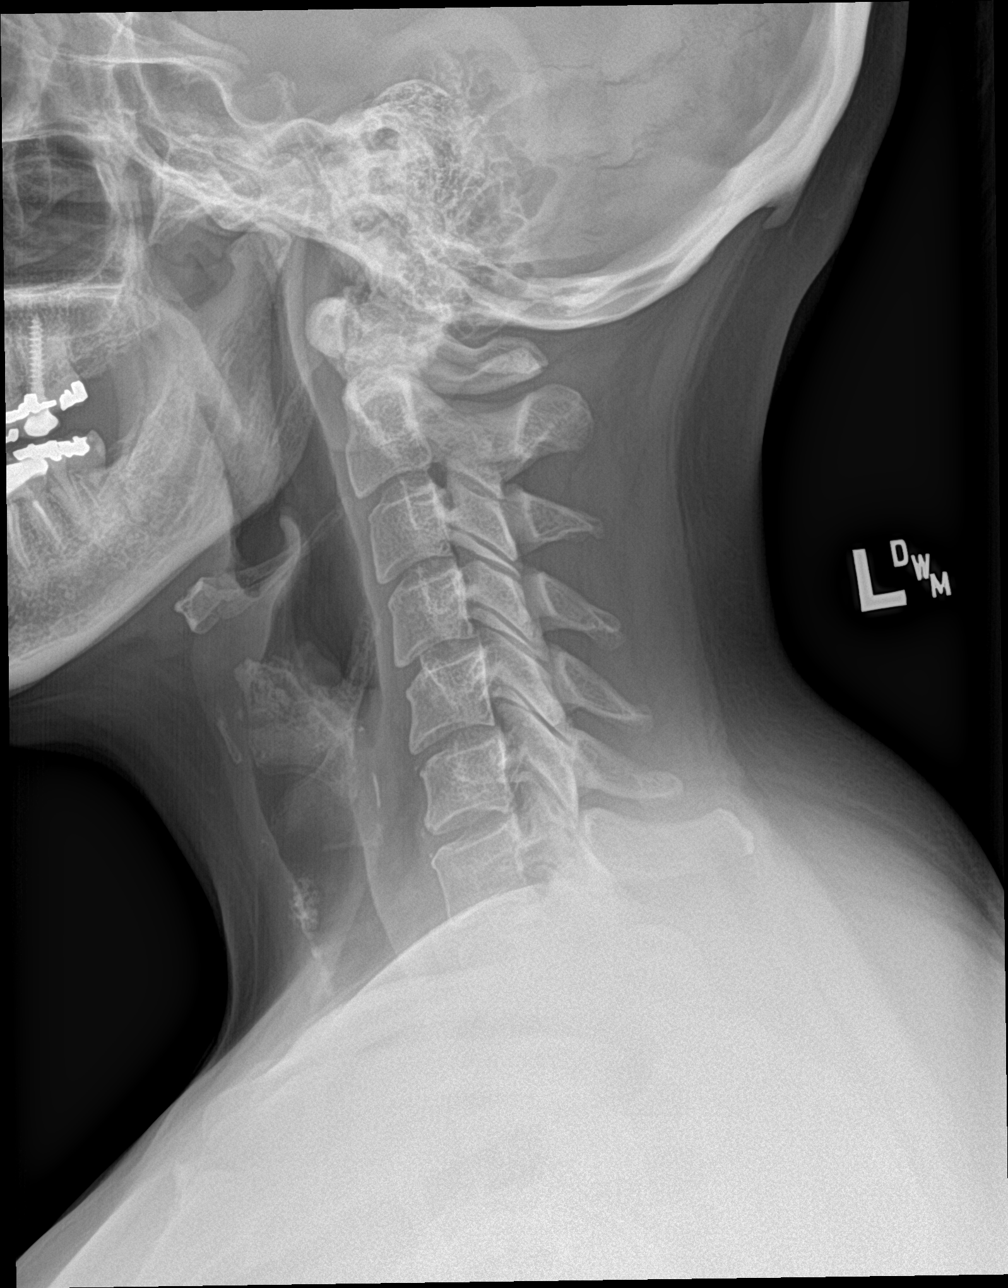

[c-spine ap]
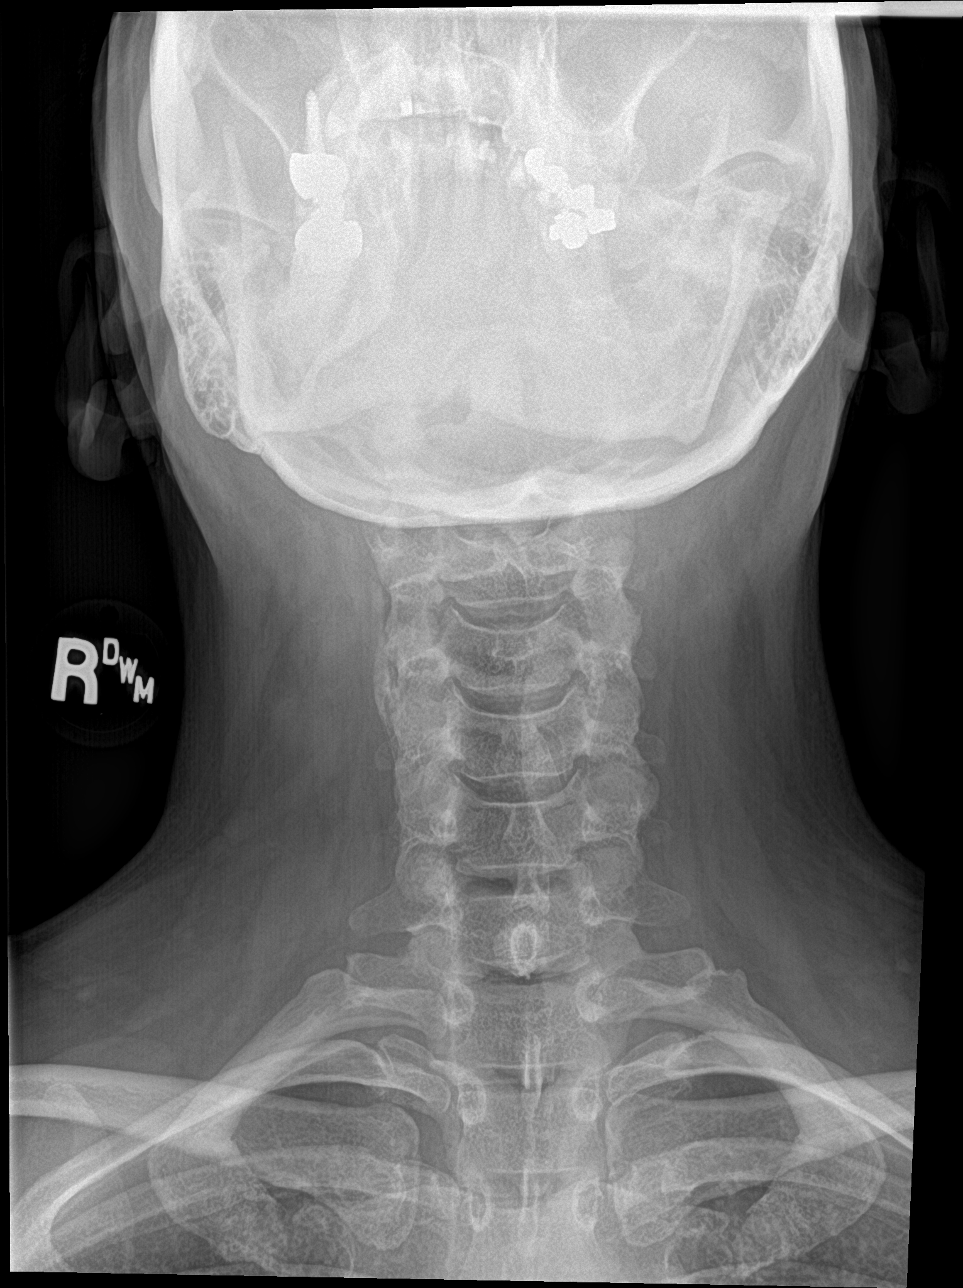

[c-spine open mouth]
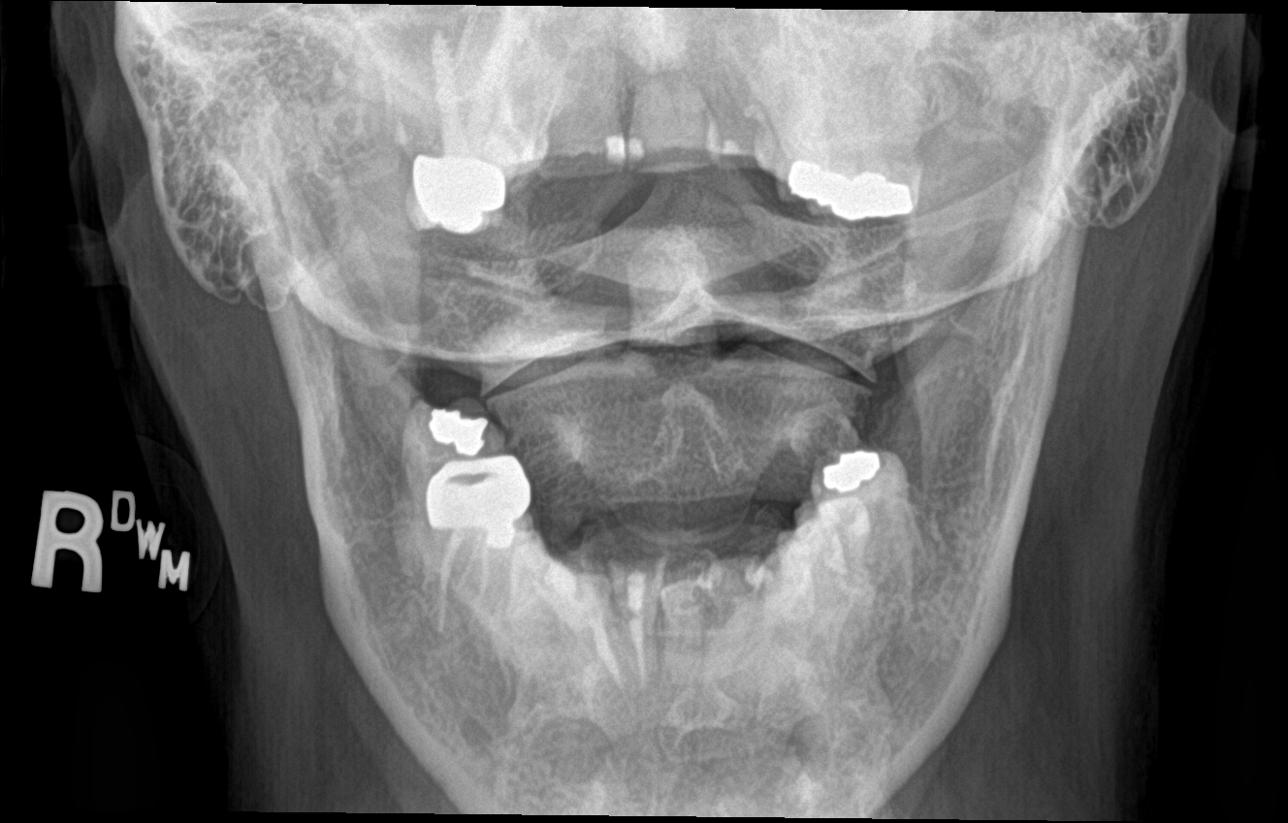

[c-spine swimmers]
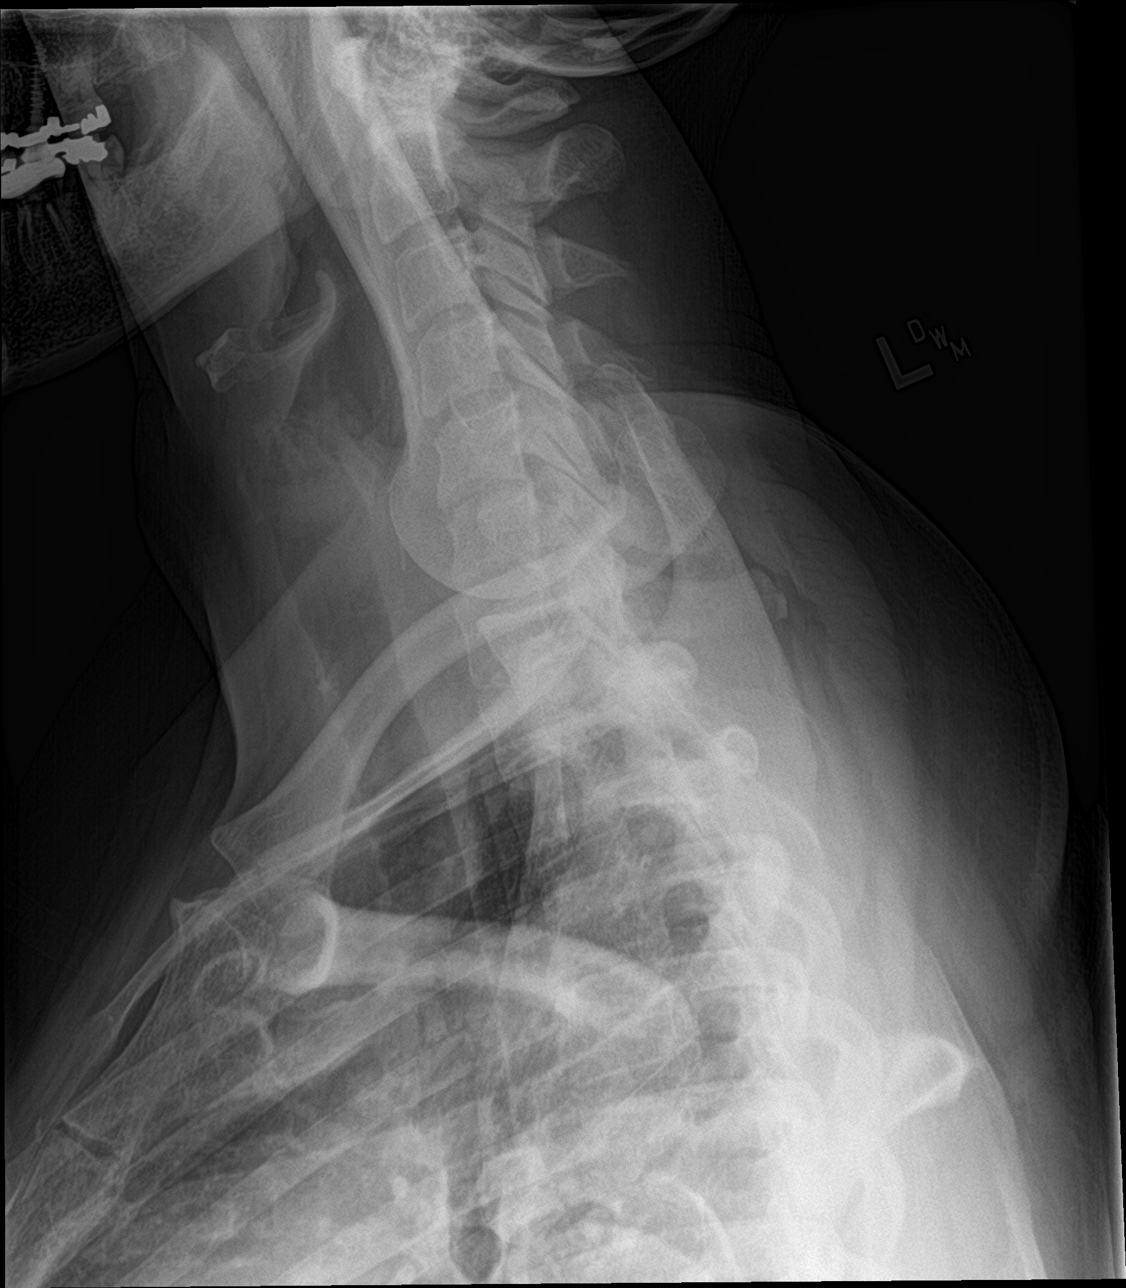

[4 of 4 positions shown; findings below may reference images not displayed]

FINDINGS: Again demonstrated is mild reversal of the normal cervical lordosis.
Otherwise, normal appearing bones. Normal appearing airway. No
visible soft tissue abnormality. No soft tissue gas. Metallic dental
work.
IMPRESSION: No acute radiographic abnormality.

## 2018-11-06 IMAGING — CT CT ABD-PELV W/ CM
2 of 5 series · 16 of 46 positions shown, 18 images · IV contrast (APPLIED)
Comparison: CT Abdomen and Pelvis 07/01/2013.

CLINICAL DATA: 41-year-old female with lower abdominal pain and
vomiting for 3 days with bloody emesis. Superficial MRSA infection
of the neck recently diagnosed.

EXAM:
CT ABDOMEN AND PELVIS WITH CONTRAST
TECHNIQUE: Multidetector CT imaging of the abdomen and pelvis was performed
using the standard protocol following bolus administration of
intravenous contrast.
CONTRAST:  100mL [id number] IOPAMIDOL ([id number]) INJECTION 61%

[Series 2: axial st · axial · 0.98mm/px · z∈[-536,-71]mm · 13 of 105 slices shown, 15 images]
[im 6/105  soft-tissue]
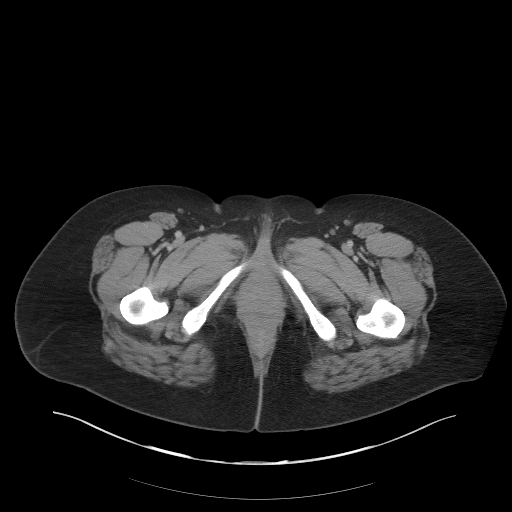
[im 6/105  bone]
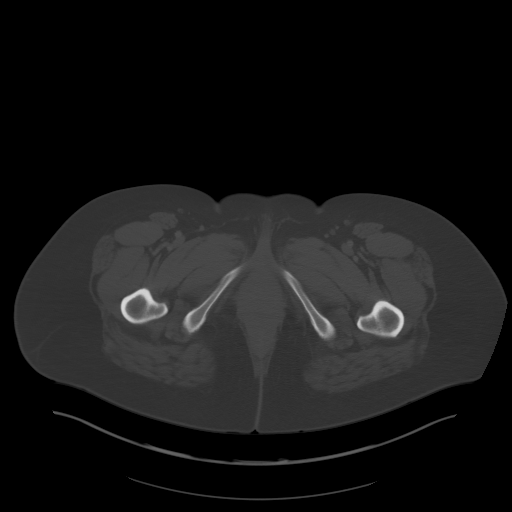
[im 17/105  soft-tissue]
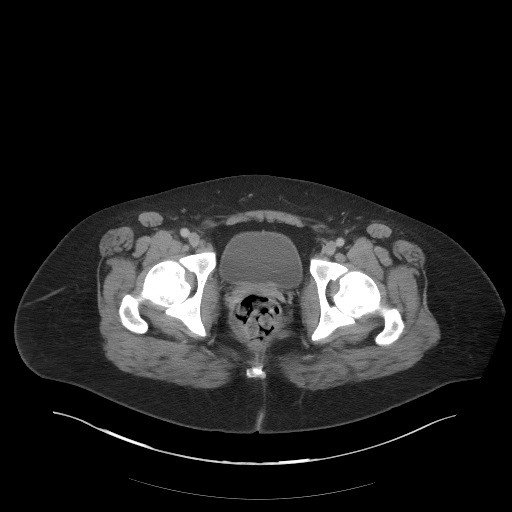
[im 22/105  soft-tissue]
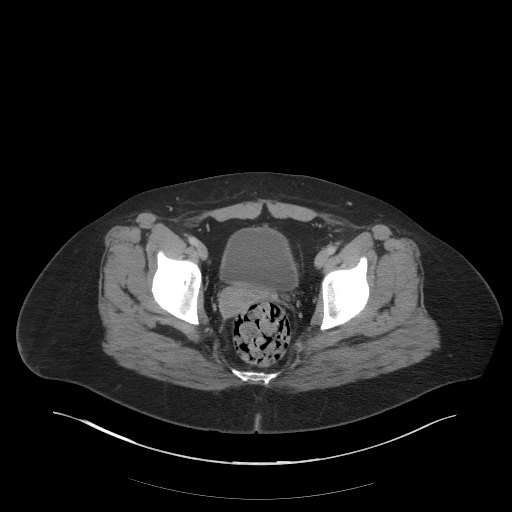
[im 28/105  soft-tissue]
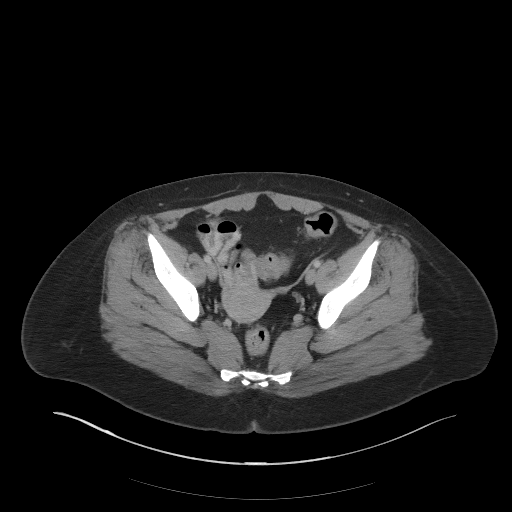
[im 39/105  soft-tissue]
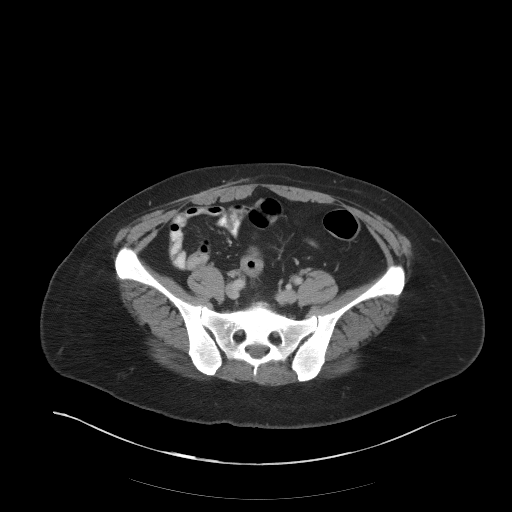
[im 44/105  soft-tissue]
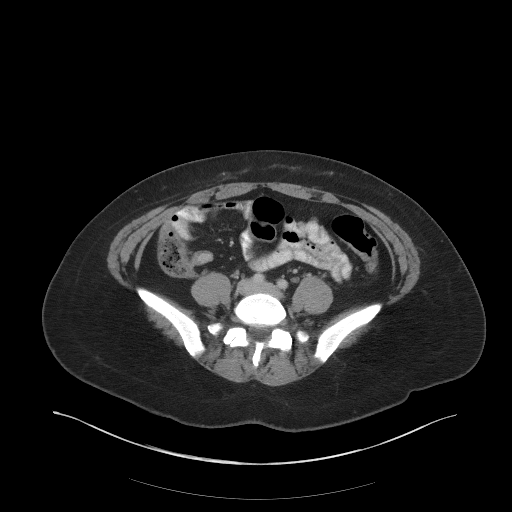
[im 55/105  soft-tissue]
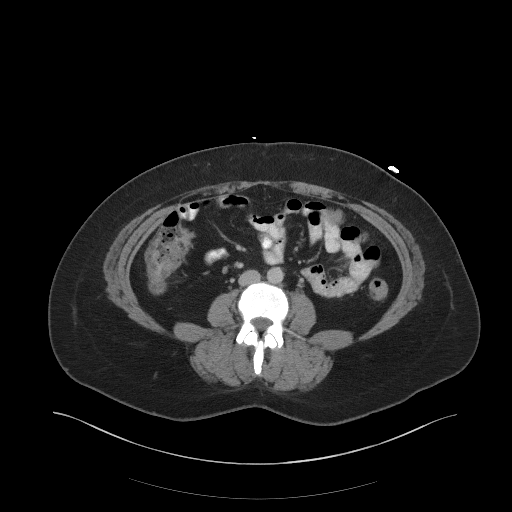
[im 61/105  soft-tissue]
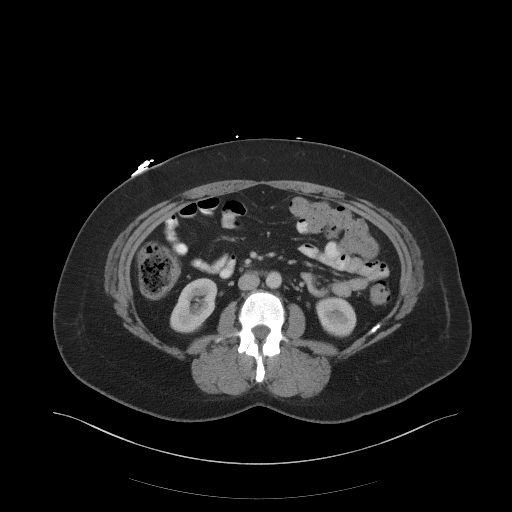
[im 66/105  soft-tissue]
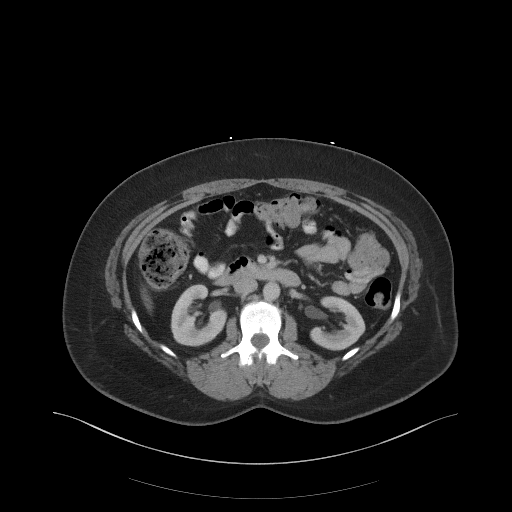
[im 66/105  bone]
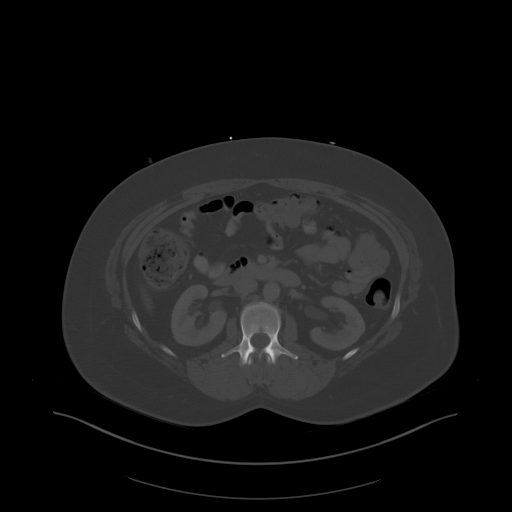
[im 77/105  soft-tissue]
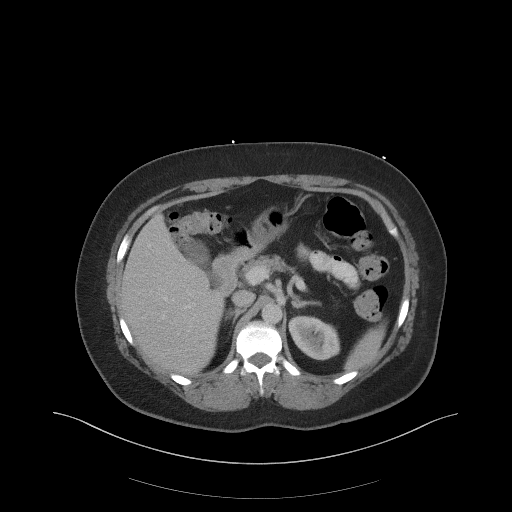
[im 83/105  soft-tissue]
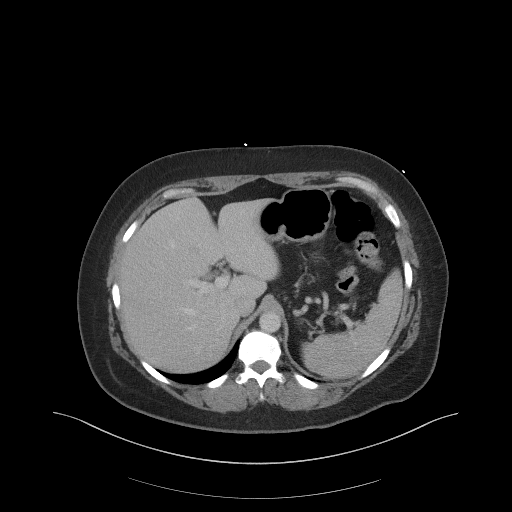
[im 88/105  soft-tissue]
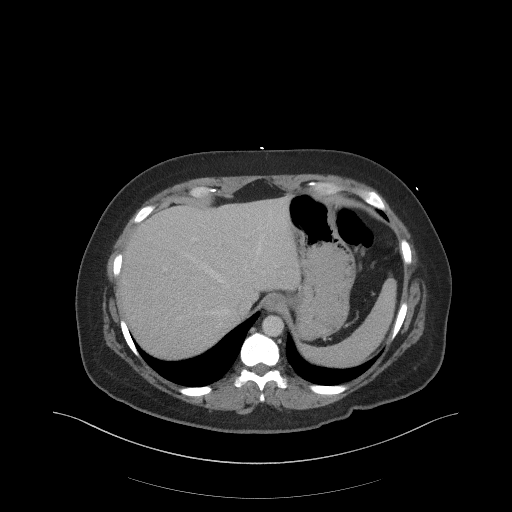
[im 99/105  soft-tissue]
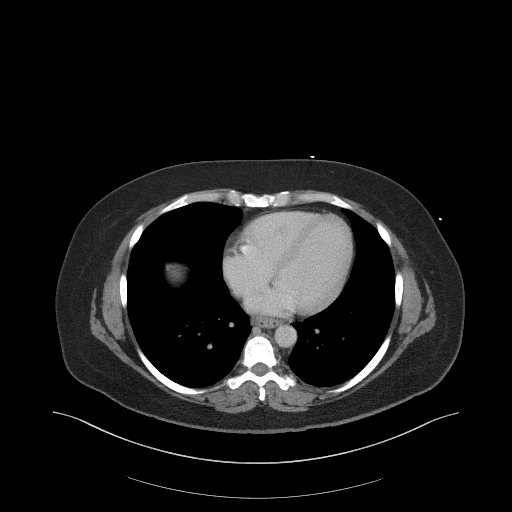

[Series 5: coronal st · coronal · 0.95mm/px · 3 of 86 slices shown]
[im 29/86  soft-tissue]
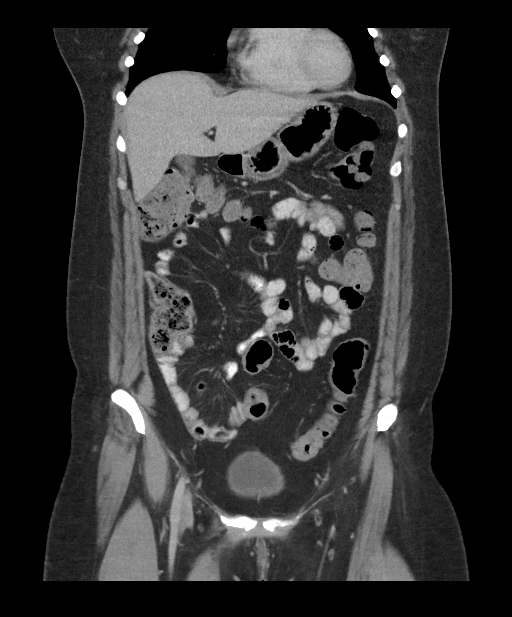
[im 38/86  soft-tissue]
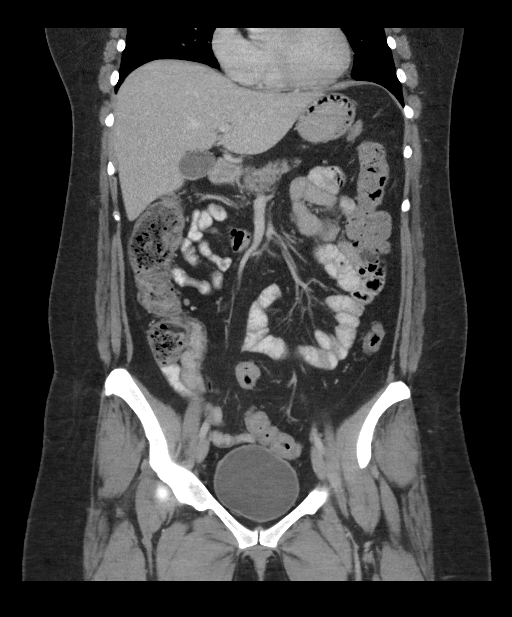
[im 48/86  soft-tissue]
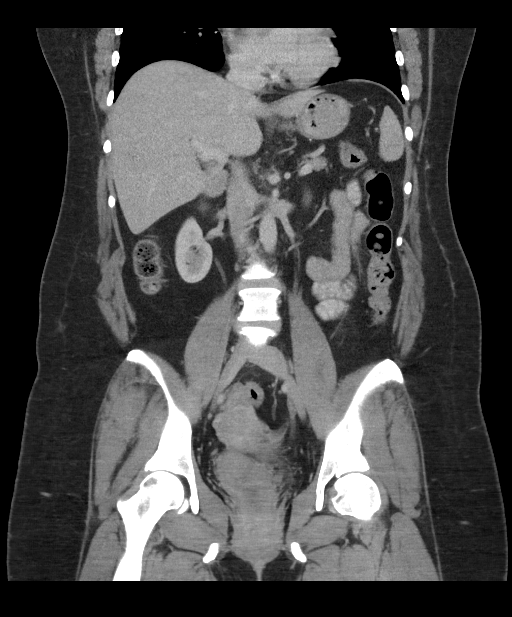

[16 of 46 positions shown; findings below may reference images not displayed]

FINDINGS: Lower chest: Normal lung bases.  No pericardial or pleural effusion.

Hepatobiliary: Negative liver and gallbladder.

Pancreas: Negative.

Spleen: Negative.

Adrenals/Urinary Tract: Normal adrenal glands. Bilateral renal
enhancement and contrast excretion is normal. Incidental duplicated
left renal collecting system and left ureter. Negative course of
both ureters.

Unremarkable urinary bladder.

Stomach/Bowel: Stool ball in the rectum. Decompressed sigmoid colon
with appearance of mild wall thickening. Diverticulosis in the
proximal sigmoid. No mesenteric inflammation.

Occasional diverticula in the descending colon. Retained stool in
the transverse colon and at both flexures. Negative right colon and
appendix. Negative terminal ileum. No dilated small bowel. Negative
stomach and duodenum.

No abdominal free fluid.  No free air.

Vascular/Lymphatic: Major arterial structures appear patent and
normal. Portal venous system is patent.

No lymphadenopathy.

Reproductive: Negative.

Other: No pelvic free fluid.

Musculoskeletal: Lower lumbar facet degeneration and L4-L5 disc
degeneration with vacuum disc. No acute osseous abnormality
identified.
IMPRESSION: 1. Mild wall thickening in the sigmoid colon could be artifact due
to underdistention but mild acute diverticulitis or colitis is
difficult to exclude.
2. Otherwise no acute or inflammatory process in the abdomen or
pelvis.
3. Incidental duplicated left renal collecting system and left
ureter (normal variant).

## 2019-12-06 ENCOUNTER — Other Ambulatory Visit (HOSPITAL_BASED_OUTPATIENT_CLINIC_OR_DEPARTMENT_OTHER): Payer: Self-pay | Admitting: Physician Assistant

## 2019-12-06 DIAGNOSIS — R112 Nausea with vomiting, unspecified: Secondary | ICD-10-CM

## 2019-12-06 DIAGNOSIS — R109 Unspecified abdominal pain: Secondary | ICD-10-CM

## 2020-12-26 ENCOUNTER — Other Ambulatory Visit: Payer: Self-pay

## 2020-12-27 ENCOUNTER — Ambulatory Visit: Payer: BC Managed Care – PPO | Admitting: Family Medicine

## 2020-12-27 ENCOUNTER — Telehealth: Payer: Self-pay

## 2020-12-27 NOTE — Telephone Encounter (Signed)
Scheduled

## 2020-12-27 NOTE — Telephone Encounter (Signed)
Pt called because she has an appointment today at 2:30 and needed to reschedule because today she started to have a fever and has been throwing up. Dr Rudds new patient appointments are now scheduling out in April due to the session limit of 2 pts in the am and 2 in pm. She was wanting to know if she could get in sooner than that. Is it okay to find another 60 minute slot sooner or do I need to schedule out in April? Please let me know so I can let pt know

## 2021-01-09 ENCOUNTER — Other Ambulatory Visit: Payer: Self-pay

## 2021-01-10 ENCOUNTER — Ambulatory Visit (INDEPENDENT_AMBULATORY_CARE_PROVIDER_SITE_OTHER): Payer: BC Managed Care – PPO | Admitting: Family Medicine

## 2021-01-10 ENCOUNTER — Encounter: Payer: Self-pay | Admitting: Family Medicine

## 2021-01-10 VITALS — BP 120/70 | HR 110 | Temp 97.4°F | Ht 66.75 in | Wt 258.4 lb

## 2021-01-10 DIAGNOSIS — E559 Vitamin D deficiency, unspecified: Secondary | ICD-10-CM

## 2021-01-10 DIAGNOSIS — K921 Melena: Secondary | ICD-10-CM

## 2021-01-10 DIAGNOSIS — F319 Bipolar disorder, unspecified: Secondary | ICD-10-CM

## 2021-01-10 DIAGNOSIS — Z1159 Encounter for screening for other viral diseases: Secondary | ICD-10-CM

## 2021-01-10 DIAGNOSIS — B029 Zoster without complications: Secondary | ICD-10-CM | POA: Insufficient documentation

## 2021-01-10 DIAGNOSIS — E119 Type 2 diabetes mellitus without complications: Secondary | ICD-10-CM

## 2021-01-10 DIAGNOSIS — E039 Hypothyroidism, unspecified: Secondary | ICD-10-CM | POA: Diagnosis not present

## 2021-01-10 DIAGNOSIS — Z114 Encounter for screening for human immunodeficiency virus [HIV]: Secondary | ICD-10-CM

## 2021-01-10 DIAGNOSIS — E1140 Type 2 diabetes mellitus with diabetic neuropathy, unspecified: Secondary | ICD-10-CM | POA: Insufficient documentation

## 2021-01-10 DIAGNOSIS — M797 Fibromyalgia: Secondary | ICD-10-CM

## 2021-01-10 DIAGNOSIS — R110 Nausea: Secondary | ICD-10-CM

## 2021-01-10 DIAGNOSIS — N3010 Interstitial cystitis (chronic) without hematuria: Secondary | ICD-10-CM

## 2021-01-10 DIAGNOSIS — K5909 Other constipation: Secondary | ICD-10-CM

## 2021-01-10 DIAGNOSIS — F411 Generalized anxiety disorder: Secondary | ICD-10-CM

## 2021-01-10 MED ORDER — ONDANSETRON HCL 4 MG PO TABS: 4.0000 mg | ORAL_TABLET | Freq: Three times a day (TID) | ORAL | 0 refills | Status: DC | PRN

## 2021-01-10 MED ORDER — LIDOCAINE 5 % EX PTCH: MEDICATED_PATCH | CUTANEOUS | 2 refills | Status: DC

## 2021-01-10 NOTE — Progress Notes (Signed)
[facility] [address] [place] [address] Dept: [phone] Dept Fax: [phone]  New Patient Office Visit  Subjective:    Patient ID: Crista Curb, female    DOB: 07/15/1976, 45 y.o..   MRN: [id number]  Chief Complaint  Patient presents with  . Establish Care    NP- establish care and c/o having GI issues x 1 year.      History of Present Illness:  Patient is in today to establish care. Ms. Ribble has a complex past medical history. She relates much of her psychiatric history to having gone through a painful divorce after supporting her husband through law school. This culminated in numerous physical complaints, substance abuse, and multiple accidental overdose situations. 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 Ms. Gotcher is in therapy at [facility] who she sees every 2 weeks. With the change in her insurance she is in need of reestablishing with a psychiatrist. She notes that her primary diagnoses are bipolar disorder and generalized anxiety. She is currently on a regimen of Seroquel 100 mg q am and 400 mg q pm, clonidine 0.2 mg TID, and lorazepam 1 mg TID. She was previously on lithium, but notes this was switched to Seroquel recently.  Ms. Posten has a history of recurrent episodes of shingles on her right flank. When these outbreaks occur, she takes a course of acyclovir.  Ms. Greenlaw has a history of Vitamin D deficiency and is on supplementation.  Ms. Marczak has a history of fibromyalgia. She uses Lidoderm patches on various locations that are tender and painful.   Ms. Nanni notes she was diagnosed with Type 2 diabetes about a year ago. She is currently on metformin 500 mg bid. She has had a dilated eye exam in the past year.  Ms. Groome has a history of interstitial cystitis. She is currently managed by urology (Dr. Sabino Gasser). She periodically  undergoes hydrodistension of the bladder, which she says has improved her symptoms. She also takes a daily nitrofurantoin dose and is on oxybutynin.  Ms. Wolff also notes issues with chronic constipation and intermittent nausea. She notes it is not unusual for her to not have a bowel movement for more than a week. She has been prescribed stool softeners int he past. She is not using any type of fiber supplement or laxative. With the intermittent nausea, she does use periodic phenergan.  Ms. Laser's record shows a history of hypothyroidism. Her constipation could be being exacerbated by this. She also has noted hair loss. She is not on any current thyroid medication.  Past Medical History: Patient Active Problem List   Diagnosis Date Noted  . Bipolar disorder (HCC) 01/10/2021  . Zoster 01/10/2021  . Type 2 diabetes mellitus without complication, without long-term current use of insulin (HCC) 01/10/2021  . Vitamin D deficiency 01/10/2021  . Chronic constipation 01/10/2021  . Hematochezia 01/10/2021  . History of substance abuse (HCC) 05/04/2018  . Hyperlipidemia 05/04/2018  . AKI (acute kidney injury) (HCC) 05/04/2018  . 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 History of repeated overdose 10/08/2017  . Chronic migraine 08/06/2017  . History of opioid abuse (HCC) 01/13/2016  . QT prolongation 01/11/2016  . Osteoarthritis 04/12/2015  . Allergic rhinitis 04/12/2015  . 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 Interstitial cystitis (chronic) without hematuria 06/21/2014  . Generalized anxiety disorder 11/30/2012  . Hypothyroidism 03/12/2012  . Low back pain 01/22/2012  . Myofascial pain  01/22/2012  . Essential (primary) hypertension 12/18/2011  . Fibromyalgia 06/27/2011   Past Surgical History:  Procedure Laterality Date  . BRAIN SURGERY    . CERVICAL BIOPSY  W/ LOOP ELECTRODE EXCISION    . NASAL SEPTUM SURGERY    . NOSE SURGERY     Family History  Problem Relation  Age of Onset  . Hypertension Mother   . Hyperlipidemia Mother   . Asthma Mother   . Arthritis Mother   . Diabetes Father   . Hypertension Father   . Hyperlipidemia Father   . Stroke Maternal Grandmother    Outpatient Medications Prior to Visit  Medication Sig Dispense Refill  . acetaminophen (TYLENOL) 500 MG tablet Take 1,000 mg by mouth every 6 (six) hours as needed for mild pain, moderate pain, fever or headache.    Marland Kitchen acyclovir (ZOVIRAX) 200 MG capsule Take 200 mg by mouth 2 (two) times daily.    . bimatoprost (LATISSE) 0.03 % ophthalmic solution Place 1 drop into both eyes 2 times daily. Place one drop on applicator and apply evenly along the skin of the upper eyelid at base of eyelashes once daily at bedtime; repeat procedure for second eye (use a clean applicator).    . cholecalciferol (VITAMIN D) 1000 units tablet Take 2,000 Units by mouth daily.    . cloNIDine (CATAPRES) 0.2 MG tablet Take 0.2 mg by mouth 2 (two) times daily.    Marland Kitchen LORazepam (ATIVAN) 1 MG tablet Take 1 mg by mouth 3 (three) times daily.    . metFORMIN (GLUCOPHAGE) 500 MG tablet Take 500 mg by mouth 2 (two) times daily.    . Multiple Vitamins-Minerals (HAIR/SKIN/NAILS) TABS Take 3-4 tablets by mouth daily.    . nitrofurantoin, macrocrystal-monohydrate, (MACROBID) 100 MG capsule Take 100 mg by mouth at bedtime.    Marland Kitchen oxybutynin (DITROPAN-XL) 5 MG 24 hr tablet Take 5 mg by mouth daily.    . QUEtiapine (SEROQUEL) 100 MG tablet Take 100 mg by mouth in the morning.    Marland Kitchen QUEtiapine (SEROQUEL) 400 MG tablet Take 400 mg by mouth at bedtime.    . lidocaine (LIDODERM) 5 % APPLY ONCE A DAY TO AFFECTED AREAS AS NEEDED. 12 HOURS ON AND 12 HOURS OFF    . lithium carbonate 300 MG capsule Take 300 mg by mouth 2 (two) times daily with a meal. (Patient not taking: Reported on 01/10/2021)    . potassium chloride SA (K-DUR,KLOR-CON) 20 MEQ tablet Take 1 tablet (20 mEq total) by mouth daily. (Patient not taking: No sig reported) 3 tablet 0   . pregabalin (LYRICA) 50 MG capsule Take 50 mg by mouth 2 (two) times daily. (Patient not taking: Reported on 01/10/2021)    . Prenatal Vit-Fe Fumarate-FA (PRENATAL MULTIVITAMIN) TABS tablet Take 1 tablet by mouth daily. (Patient not taking: Reported on 01/10/2021)     No facility-administered medications prior to visit.   Allergies  Allergen Reactions  . Shellfish Allergy Shortness Of Breath  . Naloxone Diarrhea and Nausea And Vomiting    Objective:   Today's Vitals   01/10/21 1328  BP: 120/70  Pulse: (!) 110  Temp: (!) 97.4 F (36.3 C)  TempSrc: Temporal  SpO2: 94%  Weight: 258 lb 6.4 oz (117.2 kg)  Height: 5' 6.75" (1.695 m)   Body mass index is 40.77 kg/m.   General: Well developed, well nourished. No acute distress. Psych: Alert and oriented x3. Normal mood and affect.  Health Maintenance Due  Topic Date Due  .  HEMOGLOBIN A1C  Never done  . Hepatitis C Screening  Never done  . PNEUMOCOCCAL POLYSACCHARIDE VACCINE AGE 29-64 HIGH RISK  Never done  . FOOT EXAM  Never done  . OPHTHALMOLOGY EXAM  Never done  . TETANUS/TDAP  Never done  . PAP SMEAR-Modifier  Never done  . INFLUENZA VACCINE  06/04/2020  . COLONOSCOPY (Pts 45-47yrs Insurance coverage will need to be confirmed)  Never done     Assessment & Plan:   1. Bipolar affective disorder, remission status unspecified (HCC) Ms. Mcbryar appears to be stable on Seroquel, clonidine, and lorazepam. I agreed to provide bridging therapy of her medications, while we seek to establish her with a psychiatrist to guide her therapy.  - Ambulatory referral to Psychiatry  2. Fibromyalgia Stable. Utilizes Lidoderm for pain management. This would be peferred to any use of opioids.  - lidocaine (LIDODERM) 5 %; APPLY ONCE A DAY TO AFFECTED AREAS AS NEEDED. 12 HOURS ON AND 12 HOURS OFF  Dispense: 30 patch; Refill: 2  3. Vitamin D deficiency On supplementation. I will check a Vit D level to determie if this is adequately  managed.  - VITAMIN D 25 Hydroxy (Vit-D Deficiency, Fractures)  4. Type 2 diabetes mellitus without complication, without long-term current use of insulin (HCC) Recommend annual lab assessment for diabetes. I will plan to see her back in 3 months for ongoign assessment and management.  - Lipid panel - Microalbumin / creatinine urine ratio - Basic metabolic panel - Hemoglobin A1c - Urinalysis, Routine w reflex microscopic  5. Generalized anxiety disorder Stable on lorazepam. I will continue this therapy for now, while awaiting psychiatric consultation.  6. Interstitial cystitis (chronic) without hematuria She will continue to follow with urology. We will continue nitrofurantoina nd oxybutynin.  7. Encounter for hepatitis C screening test for low risk patient  - HCV Ab w Reflex to Quant PCR  8. Screening for HIV (human immunodeficiency virus)  - HIV Antibody (routine testing w rflx)  9. Chronic constipation This may represent IBS with constipation. Because of the presence of hematochezia, she needs an evaluation to determine the source of this. I will refer her to GI. In the meantime, I recommended she add a fiber supplement daily to see if this will help reduce the constipation.  - Ambulatory referral to Gastroenterology  11. Hypothyroidism, unspecified type We will assess TSH. Ms. Varas is not on any current thyroid replacement.  - TSH  Loyola Mast, MD

## 2021-01-15 ENCOUNTER — Other Ambulatory Visit: Payer: BC Managed Care – PPO

## 2021-01-16 ENCOUNTER — Other Ambulatory Visit: Payer: BC Managed Care – PPO

## 2021-01-17 ENCOUNTER — Other Ambulatory Visit: Payer: Self-pay

## 2021-01-18 ENCOUNTER — Other Ambulatory Visit (INDEPENDENT_AMBULATORY_CARE_PROVIDER_SITE_OTHER): Payer: BC Managed Care – PPO

## 2021-01-18 DIAGNOSIS — E119 Type 2 diabetes mellitus without complications: Secondary | ICD-10-CM | POA: Diagnosis not present

## 2021-01-18 LAB — LIPID PANEL
Cholesterol: 446 mg/dL — ABNORMAL HIGH (ref 0–200)
HDL: 40.3 mg/dL (ref >39.00)
Total CHOL/HDL Ratio: 11
Triglycerides: 1197 mg/dL — ABNORMAL HIGH (ref 0.0–149.0)

## 2021-01-18 LAB — URINALYSIS, ROUTINE W REFLEX MICROSCOPIC
Bilirubin Urine: NEGATIVE
Hgb urine dipstick: NEGATIVE
Ketones, ur: NEGATIVE
Leukocytes,Ua: NEGATIVE
Nitrite: NEGATIVE
RBC / HPF: NONE SEEN (ref 0–?)
Specific Gravity, Urine: 1.01 (ref 1.000–1.030)
Total Protein, Urine: NEGATIVE
Urine Glucose: NEGATIVE
Urobilinogen, UA: 0.2 (ref 0.0–1.0)
WBC, UA: NONE SEEN (ref 0–?)
pH: 6 (ref 5.0–8.0)

## 2021-01-18 LAB — VITAMIN D 25 HYDROXY (VIT D DEFICIENCY, FRACTURES): VITD: 29.6 ng/mL — ABNORMAL LOW (ref 30.00–100.00)

## 2021-01-18 LAB — BASIC METABOLIC PANEL
BUN: 10 mg/dL (ref 6–23)
CO2: 27 mEq/L (ref 19–32)
Calcium: 9.3 mg/dL (ref 8.4–10.5)
Chloride: 96 mEq/L (ref 96–112)
Creatinine, Ser: 1.04 mg/dL (ref 0.40–1.20)
GFR: 64.91 mL/min (ref >60.00)
Glucose, Bld: 180 mg/dL — ABNORMAL HIGH (ref 70–99)
Potassium: 4.3 mEq/L (ref 3.5–5.1)
Sodium: 134 mEq/L — ABNORMAL LOW (ref 135–145)

## 2021-01-18 LAB — MICROALBUMIN / CREATININE URINE RATIO
Creatinine,U: 69.3 mg/dL
Microalb Creat Ratio: 1 mg/g (ref 0.0–30.0)
Microalb, Ur: <0.7 mg/dL (ref 0.0–1.9)

## 2021-01-18 LAB — LDL CHOLESTEROL, DIRECT: Direct LDL: 98 mg/dL

## 2021-01-18 LAB — TSH: TSH: 3.34 u[IU]/mL (ref 0.35–4.50)

## 2021-01-18 LAB — HEMOGLOBIN A1C: Hgb A1c MFr Bld: 8.5 % — ABNORMAL HIGH (ref 4.6–6.5)

## 2021-01-19 ENCOUNTER — Telehealth: Payer: Self-pay

## 2021-01-19 LAB — HIV ANTIBODY (ROUTINE TESTING W REFLEX): HIV 1&2 Ab, 4th Generation: NONREACTIVE

## 2021-01-19 LAB — HCV INTERPRETATION

## 2021-01-19 LAB — HCV AB W REFLEX TO QUANT PCR: HCV Ab: <0.1 s/co ratio (ref 0.0–0.9)

## 2021-01-19 NOTE — Telephone Encounter (Signed)
Called patient and she has tried the following medications:  Metamucil, Doculax, Stool softener, Enemia, Mag Citrate (1 whole botte) and then a suppository. She has only had 3-4 small hard stools.   She has been constipated for 2 1/2 weeks.  Her diet is all liquids right now and no fiber from food sources.  She reporting that her whole body hurts for the last 4 days.   Please advise and review.   Thanks.  Dm/cma

## 2021-01-19 NOTE — Telephone Encounter (Signed)
Pt wanted a message sent back to let the provider know she has not had a bowel movement in 2 1/2 weeks. She states she has taken Metamucil and laxatives with no relief.  Please advise

## 2021-01-22 ENCOUNTER — Encounter: Payer: Self-pay | Admitting: Family Medicine

## 2021-01-22 ENCOUNTER — Telehealth: Payer: Self-pay

## 2021-01-22 ENCOUNTER — Telehealth: Payer: Self-pay | Admitting: Family Medicine

## 2021-01-22 NOTE — Telephone Encounter (Signed)
Patient notified VIA phone and will try that and then let us know tomorrow how she is doing. Dm/cma

## 2021-01-22 NOTE — Telephone Encounter (Signed)
Pt returning call to [facility].

## 2021-01-22 NOTE — Telephone Encounter (Signed)
Pt was no show for appt 12/27/2020, pt had fever and vomitting. 1st occurrence. Fee was waived. Letter mailed.

## 2021-01-22 NOTE — Telephone Encounter (Signed)
lft VM to rtn call. Dm/cma  

## 2021-01-25 NOTE — Telephone Encounter (Signed)
Spoke to patient and she states that she went to the ER on 01/22/21. Was diagnosed with pneumonia.  Also her constipation is better.   She will call in about a week to make a hospital f/u appt. Dm/cma

## 2021-01-25 NOTE — Telephone Encounter (Signed)
Pt returning call again, she said she had been in the hospital. Please call back when you have a moment

## 2021-02-06 ENCOUNTER — Other Ambulatory Visit: Payer: Self-pay

## 2021-02-07 ENCOUNTER — Ambulatory Visit: Payer: BC Managed Care – PPO | Admitting: Family Medicine

## 2021-02-07 ENCOUNTER — Encounter: Payer: Self-pay | Admitting: Family Medicine

## 2021-02-07 VITALS — BP 124/80 | HR 102 | Temp 97.5°F | Ht 66.5 in | Wt 256.0 lb

## 2021-02-07 DIAGNOSIS — K5909 Other constipation: Secondary | ICD-10-CM

## 2021-02-07 DIAGNOSIS — F411 Generalized anxiety disorder: Secondary | ICD-10-CM

## 2021-02-07 DIAGNOSIS — F319 Bipolar disorder, unspecified: Secondary | ICD-10-CM

## 2021-02-07 DIAGNOSIS — E559 Vitamin D deficiency, unspecified: Secondary | ICD-10-CM | POA: Diagnosis not present

## 2021-02-07 DIAGNOSIS — E782 Mixed hyperlipidemia: Secondary | ICD-10-CM | POA: Diagnosis not present

## 2021-02-07 DIAGNOSIS — J189 Pneumonia, unspecified organism: Secondary | ICD-10-CM

## 2021-02-07 DIAGNOSIS — E119 Type 2 diabetes mellitus without complications: Secondary | ICD-10-CM

## 2021-02-07 DIAGNOSIS — Z6841 Body Mass Index (BMI) 40.0 and over, adult: Secondary | ICD-10-CM

## 2021-02-07 MED ORDER — ATORVASTATIN CALCIUM 40 MG PO TABS: 40.0000 mg | ORAL_TABLET | Freq: Every day | ORAL | 3 refills | Status: DC

## 2021-02-07 MED ORDER — METFORMIN HCL 500 MG PO TABS: 1000.0000 mg | ORAL_TABLET | Freq: Two times a day (BID) | ORAL | 11 refills | Status: DC

## 2021-02-07 MED ORDER — VITAMIN D (ERGOCALCIFEROL) 1.25 MG (50000 UNIT) PO CAPS: 50000.0000 [IU] | ORAL_CAPSULE | ORAL | 0 refills | Status: DC

## 2021-02-07 MED ORDER — LORAZEPAM 1 MG PO TABS: 1.0000 mg | ORAL_TABLET | Freq: Three times a day (TID) | ORAL | 2 refills | Status: DC

## 2021-02-07 MED ORDER — QUETIAPINE FUMARATE 100 MG PO TABS: 100.0000 mg | ORAL_TABLET | Freq: Every morning | ORAL | 3 refills | Status: DC

## 2021-02-07 NOTE — Progress Notes (Signed)
[facility] [address] [place] [address] Dept: [phone] Dept Fax: [phone]  Chronic Care Office Visit  Subjective:    Patient ID: Brandi Welch, female    DOB: [birth year], 45 y.o..   MRN: [id number]  Chief Complaint  Patient presents with  . Follow-up    F/u meds and lab results.  She is okay with starting cholesterol medication, and the increase in Metformin.  She needs refills on Lorazepam and Seroquel 100 mg.     History of Present Illness:  Patient is in today for reassessment of chronic medical issues. Since her last visit, Ms. Dhillon was seen int he ED and diagnosed with LLL pneumonia. She was managed as an outpatient, she needed to be at home with her mother. She has completed her antibiotic course. She has had some improvement in her symptoms, but continue to feel some fatigue associated with her infection.  Ms. Slavick has not been able to connect with a psychiatrist yet. She notes that she was referred to a psychiatric practice in [place]. She was told by the scheduler that they did counseling only and not medication management.  Ms. Bensinger's HbA1c was elevated at her last visit. She was asked to increase her metformin to 1000 mg bid. She has done so and is tolerating the new dose well. She has concerns about her weight. She notes she had seen advertisements on TV for GOLO dietary supplements and wonders if these might help her, as they are said to improve insulin resistance.  Ms. Hundertmark has a history of GAD. She continues on her lorazepam three times a day. She also continues to be in counseling with Ms. Cena Benton.  Ms. Mastrogiovanni continues to have issues with constipation. She was referred to GI, but has not followed through on scheduling. She currently uses a combination of metamucil, Colace, and Dulcolax, but still has issues with constipation.  Ms. Camarena was found to have significantly  elevated lipids at her last visit. She has not previously been on lipid-lowering therapy and is willing to try this.  Past Medical History: Patient Active Problem List   Diagnosis Date Noted  . Class 3 severe obesity due to excess calories with serious comorbidity and body mass index (BMI) of 40.0 to 44.9 in adult [facility]) 02/07/2021  . Bipolar disorder (HCC) 01/10/2021  . Zoster 01/10/2021  . Type 2 diabetes mellitus without complication, without long-term current use of insulin (HCC) 01/10/2021  . Vitamin D deficiency 01/10/2021  . Chronic constipation 01/10/2021  . Hematochezia 01/10/2021  . History of substance abuse (HCC) 05/04/2018  . Hyperlipidemia 05/04/2018  . AKI (acute kidney injury) (HCC) 05/04/2018  . 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 History of repeated overdose 10/08/2017  . Chronic migraine 08/06/2017  . History of opioid abuse (HCC) 01/13/2016  . QT prolongation 01/11/2016  . Osteoarthritis 04/12/2015  . Allergic rhinitis 04/12/2015  . 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 Interstitial cystitis (chronic) without hematuria 06/21/2014  . Generalized anxiety disorder 11/30/2012  . Hypothyroidism 03/12/2012  . Low back pain 01/22/2012  . Myofascial pain 01/22/2012  . Essential (primary) hypertension 12/18/2011  . Fibromyalgia 06/27/2011   Past Surgical History:  Procedure Laterality Date  . BRAIN SURGERY    . CERVICAL BIOPSY  W/ LOOP ELECTRODE EXCISION    . NASAL SEPTUM SURGERY    . NOSE SURGERY     Family History  Problem Relation Age of Onset  . Hypertension Mother   . Hyperlipidemia  Mother   . Asthma Mother   . Arthritis Mother   . Diabetes Father   . Hypertension Father   . Hyperlipidemia Father   . Stroke Maternal Grandmother    Outpatient Medications Prior to Visit  Medication Sig Dispense Refill  . acetaminophen (TYLENOL) 500 MG tablet Take 1,000 mg by mouth every 6 (six) hours as needed for mild pain, moderate pain, fever or  headache.    Marland Kitchen acyclovir (ZOVIRAX) 200 MG capsule Take 200 mg by mouth 2 (two) times daily.    . bimatoprost (LATISSE) 0.03 % ophthalmic solution Place 1 drop into both eyes 2 times daily. Place one drop on applicator and apply evenly along the skin of the upper eyelid at base of eyelashes once daily at bedtime; repeat procedure for second eye (use a clean applicator).    . cholecalciferol (VITAMIN D) 1000 units tablet Take 2,000 Units by mouth daily.    . cloNIDine (CATAPRES) 0.2 MG tablet Take 0.2 mg by mouth 2 (two) times daily.    Marland Kitchen lidocaine (LIDODERM) 5 % APPLY ONCE A DAY TO AFFECTED AREAS AS NEEDED. 12 HOURS ON AND 12 HOURS OFF 30 patch 2  . Multiple Vitamins-Minerals (HAIR/SKIN/NAILS) TABS Take 3-4 tablets by mouth daily.    Marland Kitchen oxybutynin (DITROPAN-XL) 5 MG 24 hr tablet Take 5 mg by mouth daily.    . QUEtiapine (SEROQUEL) 400 MG tablet Take 400 mg by mouth at bedtime.    Marland Kitchen LORazepam (ATIVAN) 1 MG tablet Take 1 mg by mouth 3 (three) times daily.    . metFORMIN (GLUCOPHAGE) 500 MG tablet Take 1,000 mg by mouth 2 (two) times daily.    . QUEtiapine (SEROQUEL) 100 MG tablet Take 100 mg by mouth in the morning.    Marland Kitchen albuterol (VENTOLIN HFA) 108 (90 Base) MCG/ACT inhaler SMARTSIG:2 Puff(s) By Mouth 4 Times Daily    . nitrofurantoin, macrocrystal-monohydrate, (MACROBID) 100 MG capsule Take 100 mg by mouth at bedtime. (Patient not taking: Reported on 02/07/2021)    . ondansetron (ZOFRAN) 4 MG tablet Take 1 tablet (4 mg total) by mouth every 8 (eight) hours as needed for nausea or vomiting. (Patient not taking: Reported on 02/07/2021) 20 tablet 0   No facility-administered medications prior to visit.   Allergies  Allergen Reactions  . Shellfish Allergy Shortness Of Breath  . Naloxone Diarrhea and Nausea And Vomiting   Objective:   Today's Vitals   02/07/21 1414  BP: 124/80  Pulse: (!) 102  Temp: (!) 97.5 F (36.4 C)  TempSrc: Temporal  SpO2: 93%  Weight: 256 lb (116.1 kg)  Height: 5' 6.5"  (1.689 m)   Body mass index is 40.7 kg/m.   General: Well developed, well nourished. No acute distress. Psych: Alert and oriented. Normal mood and affect.  Health Maintenance Due  Topic Date Due  . PNEUMOCOCCAL POLYSACCHARIDE VACCINE AGE 28-64 HIGH RISK  Never done  . FOOT EXAM  Never done  . OPHTHALMOLOGY EXAM  Never done  . TETANUS/TDAP  Never done  . PAP SMEAR-Modifier  Never done  . COLONOSCOPY (Pts 45-54yrs Insurance coverage will need to be confirmed)  Never done   Lab Results Lab Results  Component Value Date   TSH 3.34 01/18/2021   Lab Results  Component Value Date   CHOL 446 (H) 01/18/2021   HDL 40.30 01/18/2021   LDLDIRECT 98.0 01/18/2021   TRIG (H) 01/18/2021    1197.0 Triglyceride is over 400; calculations on Lipids are invalid.   CHOLHDL 11  01/18/2021   Lab Results  Component Value Date   HGBA1C 8.5 (H) 01/18/2021     Last vitamin D Lab Results  Component Value Date   VD25OH 29.60 (L) 01/18/2021   Assessment & Plan:   1. Pneumonia of left lower lobe due to infectious organism Resolving. We discussed that it may take some time to fully recover,s o she should be getting adequate rest for this.  2. Bipolar affective disorder, remission status unspecified (HCC) I will renew her Seroquel today. I urged her to reach back to Behavioral Health in regards to schedulign with a psychiatrist to assist in med management for her Bipolar disorder.  - QUEtiapine (SEROQUEL) 100 MG tablet; Take 1 tablet (100 mg total) by mouth in the morning.  Dispense: 90 tablet; Refill: 3  3. Vitamin D deficiency In light of the persistently low Vitamin D, I will place her on a replacement dose for 3 months. We will then reassess her level.  - Vitamin D, Ergocalciferol, (DRISDOL) 1.25 MG (50000 UNIT) CAPS capsule; Take 1 capsule (50,000 Units total) by mouth every 7 (seven) days.  Dispense: 12 capsule; Refill: 0  4. Type 2 diabetes mellitus without complication, without long-term  current use of insulin (HCC) Recently increased her metformin. We will plan to check her HbA1c at her next visit. I advised against using TV advertised supplements for weight loss. I will refer her for medical nutrition counseling in regards to her diabetes and weight.  - Amb ref to Medical Nutrition Therapy-MNT - metFORMIN (GLUCOPHAGE) 500 MG tablet; Take 2 tablets (1,000 mg total) by mouth 2 (two) times daily.  Dispense: 180 tablet; Refill: 11  5. Generalized anxiety disorder Stable. Continue counseling. I will renew her Ativan.  - LORazepam (ATIVAN) 1 MG tablet; Take 1 tablet (1 mg total) by mouth 3 (three) times daily.  Dispense: 90 tablet; Refill: 2  6. Chronic constipation We discussed her adding Miralax, 1 heaping tablespoon in beverage daily as needed for constipation. She is to follow-up about scheduling for gastroenterology assessment.  7. Mixed hyperlipidemia We will initiate Lipitor today and reassess lipids in 6 months.  - atorvastatin (LIPITOR) 40 MG tablet; Take 1 tablet (40 mg total) by mouth daily.  Dispense: 90 tablet; Refill: 3  8. Class 3 severe obesity due to excess calories with serious comorbidity and body mass index (BMI) of 40.0 to 44.9 in adult [facility]) As noted above related to diabetes and weight loss.  - Amb ref to Medical Nutrition Therapy-MNT  Loyola Mast, MD

## 2021-02-15 ENCOUNTER — Telehealth: Payer: Self-pay | Admitting: Family Medicine

## 2021-02-15 NOTE — Telephone Encounter (Signed)
Pt is wanting a referral for a Psychologist. She says we have requested her to go to [facility]. She is wanting someone to be able to prescribe her medication. She feels [facility] will not be able to help with this. Please advise at [phone].

## 2021-02-15 NOTE — Telephone Encounter (Signed)
Patient notified that provider is out of the office and will forward this message.   Please review patient message about referral.  Thanks.  Dm/cma

## 2021-02-20 NOTE — Telephone Encounter (Signed)
That number is for  [facility]

## 2021-02-20 NOTE — Telephone Encounter (Signed)
Tried to call and had to lvm

## 2021-02-20 NOTE — Telephone Encounter (Signed)
Pt should be able to call [phone] to schedule

## 2021-02-20 NOTE — Telephone Encounter (Signed)
Can you please help patient get an appt with psychiatry.  Thanks. Dm/cma

## 2021-02-21 NOTE — Telephone Encounter (Signed)
I talked to pt and this was the place she was talking about originally and that they only had a therapist option . Per request I sent to    [facility]

## 2021-03-07 ENCOUNTER — Other Ambulatory Visit: Payer: Self-pay | Admitting: Family Medicine

## 2021-03-22 ENCOUNTER — Encounter: Payer: BC Managed Care – PPO | Attending: Family Medicine | Admitting: Dietician

## 2021-04-11 ENCOUNTER — Other Ambulatory Visit: Payer: Self-pay

## 2021-04-11 ENCOUNTER — Ambulatory Visit (INDEPENDENT_AMBULATORY_CARE_PROVIDER_SITE_OTHER): Payer: BC Managed Care – PPO | Admitting: Family Medicine

## 2021-04-11 ENCOUNTER — Encounter: Payer: Self-pay | Admitting: Family Medicine

## 2021-04-11 VITALS — BP 140/86 | HR 105 | Temp 98.2°F | Ht 66.5 in | Wt 255.8 lb

## 2021-04-11 DIAGNOSIS — F411 Generalized anxiety disorder: Secondary | ICD-10-CM

## 2021-04-11 DIAGNOSIS — G44099 Other trigeminal autonomic cephalgias (TAC), not intractable: Secondary | ICD-10-CM

## 2021-04-11 DIAGNOSIS — F319 Bipolar disorder, unspecified: Secondary | ICD-10-CM

## 2021-04-11 DIAGNOSIS — E559 Vitamin D deficiency, unspecified: Secondary | ICD-10-CM | POA: Diagnosis not present

## 2021-04-11 DIAGNOSIS — I10 Essential (primary) hypertension: Secondary | ICD-10-CM | POA: Diagnosis not present

## 2021-04-11 DIAGNOSIS — N3010 Interstitial cystitis (chronic) without hematuria: Secondary | ICD-10-CM | POA: Diagnosis not present

## 2021-04-11 DIAGNOSIS — E119 Type 2 diabetes mellitus without complications: Secondary | ICD-10-CM | POA: Diagnosis not present

## 2021-04-11 MED ORDER — LISINOPRIL 10 MG PO TABS: 10.0000 mg | ORAL_TABLET | Freq: Every day | ORAL | 3 refills | Status: DC

## 2021-04-11 NOTE — Progress Notes (Signed)
[facility] [address] [place] [address] Dept: [phone] Dept Fax: [phone]  Chronic Care Office Visit  Subjective:    Patient ID: Brandi Welch, female    DOB: [birth year], 45 y.o..   MRN: [id number]  Chief Complaint  Patient presents with  . Follow-up    2 month f/u DM. Average BS 180- 410 with a couple reading of 500- 600. Would like a referral to a pain clinic.     History of Present Illness:  Patient is in today for reassessment of chronic medical issues.  Ms. Ridings admits to ongoing anxiety issues. She feels some of this is being driven by her chronic pain conditions. She did try to seek care with a psychiatrist for co-management of her anxiety and bipolar disorder, but has been unable to establish this care. She continues on Seroquel and lorazepam. Ms. Naraine notes her interstitial cystitis is not doing as well after her last bladder dilation. She does have a follow-up established with Dr. Burton Apley. She also notes that she has been having flares of right-sided headache that she associates with a prior issue with trigeminal neuralgia/autonomic cephalgias. She underwent a prior procedure to use Teflon to pad a blood vessel that was putting pressure on her trigeminal nerve. This had previously helped, but she is now concerned that this may be failing. She does wonder about being seen in a pain clinic to establish other approaches for dealing with her symptoms.  Ms. Schulte has a history of Type 2 diabetes. She is currently on metformin 1000 mg bid. She feels she has been consistently taking her medication. She has noted some wide swings in her blood sugars at home. She has been on clonidine for her hypertension, but is not sure why other agents had not been tried. She has not had any reactions to any blood pressure meds.  Ms. Oetken was found to be Vitamin D deficient. She completed a course of Vit D  replacement and is now taking the OTC maintenance dose.  Past Medical History: Patient Active Problem List   Diagnosis Date Noted  . Class 3 severe obesity due to excess calories with serious comorbidity and body mass index (BMI) of 40.0 to 44.9 in adult [facility]) 02/07/2021  . Bipolar disorder (HCC) 01/10/2021  . Zoster 01/10/2021  . Type 2 diabetes mellitus without complication, without long-term current use of insulin (HCC) 01/10/2021  . Vitamin D deficiency 01/10/2021  . Chronic constipation 01/10/2021  . Hematochezia 01/10/2021  . History of substance abuse (HCC) 05/04/2018  . Hyperlipidemia 05/04/2018  . AKI (acute kidney injury) (HCC) 05/04/2018  . 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 History of repeated overdose 10/08/2017  . Chronic migraine 08/06/2017  . History of opioid abuse (HCC) 01/13/2016  . QT prolongation 01/11/2016  . Osteoarthritis 04/12/2015  . Allergic rhinitis 04/12/2015  . 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 Interstitial cystitis (chronic) without hematuria 06/21/2014  . Generalized anxiety disorder 11/30/2012  . Low back pain 01/22/2012  . Myofascial pain 01/22/2012  . Essential hypertension 12/18/2011  . Fibromyalgia 06/27/2011   Past Surgical History:  Procedure Laterality Date  . BRAIN SURGERY    . CERVICAL BIOPSY  W/ LOOP ELECTRODE EXCISION    . NASAL SEPTUM SURGERY    . NOSE SURGERY     Family History  Problem Relation Age of Onset  . Hypertension Mother   . Hyperlipidemia Mother   . Asthma Mother   . Arthritis Mother   .  Diabetes Father   . Hypertension Father   . Hyperlipidemia Father   . Stroke Maternal Grandmother    Outpatient Medications Prior to Visit  Medication Sig Dispense Refill  . acetaminophen (TYLENOL) 500 MG tablet Take 1,000 mg by mouth every 6 (six) hours as needed for mild pain, moderate pain, fever or headache.    Marland Kitchen acyclovir (ZOVIRAX) 200 MG capsule Take 200 mg by mouth 2 (two) times daily.    Marland Kitchen  albuterol (VENTOLIN HFA) 108 (90 Base) MCG/ACT inhaler SMARTSIG:2 Puff(s) By Mouth 4 Times Daily    . atorvastatin (LIPITOR) 40 MG tablet Take 1 tablet (40 mg total) by mouth daily. 90 tablet 3  . bimatoprost (LATISSE) 0.03 % ophthalmic solution Place 1 drop into both eyes 2 times daily. Place one drop on applicator and apply evenly along the skin of the upper eyelid at base of eyelashes once daily at bedtime; repeat procedure for second eye (use a clean applicator).    . cholecalciferol (VITAMIN D) 1000 units tablet Take 2,000 Units by mouth daily.    Marland Kitchen lidocaine (LIDODERM) 5 % APPLY ONCE A DAY TO AFFECTED AREAS AS NEEDED. 12 HOURS ON AND 12 HOURS OFF 30 patch 2  . LORazepam (ATIVAN) 1 MG tablet Take 1 tablet (1 mg total) by mouth 3 (three) times daily. 90 tablet 2  . metFORMIN (GLUCOPHAGE) 500 MG tablet Take 2 tablets (1,000 mg total) by mouth 2 (two) times daily. 180 tablet 11  . Multiple Vitamins-Minerals (HAIR/SKIN/NAILS) TABS Take 3-4 tablets by mouth daily.    Marland Kitchen oxybutynin (DITROPAN-XL) 5 MG 24 hr tablet Take 5 mg by mouth daily.    . QUEtiapine (SEROQUEL) 100 MG tablet Take 1 tablet (100 mg total) by mouth in the morning. 90 tablet 3  . QUEtiapine (SEROQUEL) 400 MG tablet TAKE 1 TABLET BY MOUTH AT BEDTIME 90 tablet 3  . cloNIDine (CATAPRES) 0.2 MG tablet Take 0.2 mg by mouth 2 (two) times daily.    . Vitamin D, Ergocalciferol, (DRISDOL) 1.25 MG (50000 UNIT) CAPS capsule Take 1 capsule (50,000 Units total) by mouth every 7 (seven) days. (Patient not taking: Reported on 04/11/2021) 12 capsule 0   No facility-administered medications prior to visit.   Allergies  Allergen Reactions  . Shellfish Allergy Shortness Of Breath  . Naloxone Diarrhea and Nausea And Vomiting    Objective:   Today's Vitals   04/11/21 1428  BP: 140/86  Pulse: (!) 105  Temp: 98.2 F (36.8 C)  TempSrc: Temporal  SpO2: 95%  Weight: 255 lb 12.8 oz (116 kg)  Height: 5' 6.5" (1.689 m)   Body mass index is 40.67  kg/m.   General: Well developed, well nourished. No acute distress. Psych: Alert and oriented. Normal mood and affect.  Health Maintenance Due  Topic Date Due  . PNEUMOCOCCAL POLYSACCHARIDE VACCINE AGE 70-64 HIGH RISK  Never done  . Pneumococcal Vaccine 37-18 Years old (1 of 2 - PPSV23) Never done  . FOOT EXAM  Never done  . OPHTHALMOLOGY EXAM  Never done  . TETANUS/TDAP  Never done  . PAP SMEAR-Modifier  Never done  . COLONOSCOPY (Pts 45-79yrs Insurance coverage will need to be confirmed)  Never done   Lab Results    Assessment & Plan:   1. Essential hypertension Ms. Thortnon's blood pressure remains a bit high today. I am not convinced that clonidine is the best approach to her blood pressure. I recommend we stop this medication and start her on lisinopril. I will  have her return in 3 months for reassessment.  - lisinopril (ZESTRIL) 10 MG tablet; Take 1 tablet (10 mg total) by mouth daily.  Dispense: 90 tablet; Refill: 3  2. Type 2 diabetes mellitus without complication, without long-term current use of insulin (HCC) It is not clear how adequate her control is on her current medication.  Her last HbA1c was 8.5. I will reassesses her HbA1c today.if this remains elevated, we will consider the addition of a GLP-1 receptor agonist, as this may also aide her in weight loss.   - Hemoglobin A1c  3. Interstitial cystitis (chronic) without hematuria Although Ms. Virgo will continue to work with her urologist. I do think it is reasonable for her to be evaluated for other potential pain management options. Her past history of opioid abuse does limit any approaches that might utilize opioids.  - Ambulatory referral to Pain Clinic  4. Trigeminal autonomic cephalgias As above. She may also benefit from being reevaluated by neurosurgery, in case she needs further interventions for the neuralgia.  - Ambulatory referral to Pain Clinic  5. Generalized anxiety disorder Ms. Towles appears  more anxious today. I had hoped we would be able to get her in to see a psychiatrist to help co-manage her symptoms, but so far she was unable to pay for care up-front at one clinic and has been unable to find another prescribing clinic. She has started counseling with Wandra Feinstein and is finding this helpful (she had initial doubts). If she persists in having poorly controlled anxiety at her next visit, I would consider possible addition of an SSRI in combinaiton with her Seroquel for her Bipolar disorder.  6. Bipolar affective disorder, remission status unspecified (HCC) As baove.  7. Vitamin D deficiency I will check a Vit D level today.  - VITAMIN D 25 Hydroxy (Vit-D Deficiency, Fractures)  Loyola Mast, MD

## 2021-04-12 LAB — HEMOGLOBIN A1C: Hgb A1c MFr Bld: 10 % — ABNORMAL HIGH (ref 4.6–6.5)

## 2021-04-12 LAB — VITAMIN D 25 HYDROXY (VIT D DEFICIENCY, FRACTURES): VITD: 67.88 ng/mL (ref 30.00–100.00)

## 2021-04-13 ENCOUNTER — Ambulatory Visit: Payer: BC Managed Care – PPO | Admitting: Family Medicine

## 2021-04-17 ENCOUNTER — Telehealth: Payer: Self-pay | Admitting: Family Medicine

## 2021-04-17 DIAGNOSIS — R110 Nausea: Secondary | ICD-10-CM

## 2021-04-17 DIAGNOSIS — E119 Type 2 diabetes mellitus without complications: Secondary | ICD-10-CM

## 2021-04-17 MED ORDER — RYBELSUS 3 MG PO TABS: 3.0000 mg | ORAL_TABLET | Freq: Every day | ORAL | 0 refills | Status: DC

## 2021-04-17 NOTE — Telephone Encounter (Signed)
Patient states that she would like to take the weight loss medication that also works with her blood sugars. She is also requesting a refill on her nausea medication. Please call her at [phone] if you have any questions.

## 2021-04-18 MED ORDER — ONDANSETRON HCL 4 MG PO TABS: 4.0000 mg | ORAL_TABLET | Freq: Three times a day (TID) | ORAL | 0 refills | Status: DC | PRN

## 2021-04-18 NOTE — Telephone Encounter (Signed)
Unable to leave VM due to mail box is full.  Dm/cma  

## 2021-04-18 NOTE — Addendum Note (Signed)
Addended by: Loyola Mast on: 04/18/2021 06:17 PM   Modules accepted: Orders

## 2021-04-18 NOTE — Telephone Encounter (Signed)
Lft VM to rtn call. Dm/cma  

## 2021-04-18 NOTE — Telephone Encounter (Signed)
Spoke to patient, and advise that medication was sent to the pharmacy.  Appt scheduled for 05/16/21.   Can you sen something for her nausea that she is having daily either in the am or at night? She will call to get an appt with the GI doctor and wants to know if you will give her something till then.  Please review and advise.  Thanks.  Dm/cma

## 2021-04-19 NOTE — Telephone Encounter (Signed)
Unable to leave VM due to box being full. Will try later. Dm/cma

## 2021-05-01 ENCOUNTER — Encounter: Payer: Self-pay | Admitting: Physical Medicine & Rehabilitation

## 2021-05-01 ENCOUNTER — Other Ambulatory Visit: Payer: Self-pay | Admitting: Family Medicine

## 2021-05-01 ENCOUNTER — Telehealth: Payer: Self-pay

## 2021-05-01 DIAGNOSIS — R110 Nausea: Secondary | ICD-10-CM

## 2021-05-01 NOTE — Telephone Encounter (Signed)
Pt calling to follow up on a referral sent on 04/11/21 to the pain clinic.  Pt has not heard anything from them and wanted an update.  I gave pt the number to the [facility] for her to call to schedule and appointment.

## 2021-05-06 ENCOUNTER — Other Ambulatory Visit: Payer: Self-pay | Admitting: Family Medicine

## 2021-05-06 DIAGNOSIS — F411 Generalized anxiety disorder: Secondary | ICD-10-CM

## 2021-05-08 ENCOUNTER — Ambulatory Visit: Payer: BC Managed Care – PPO | Admitting: Dietician

## 2021-05-09 ENCOUNTER — Other Ambulatory Visit: Payer: Self-pay

## 2021-05-09 ENCOUNTER — Encounter: Payer: Self-pay | Admitting: Dietician

## 2021-05-09 ENCOUNTER — Encounter: Payer: BC Managed Care – PPO | Attending: Family Medicine | Admitting: Dietician

## 2021-05-09 DIAGNOSIS — E119 Type 2 diabetes mellitus without complications: Secondary | ICD-10-CM | POA: Diagnosis present

## 2021-05-09 NOTE — Patient Instructions (Addendum)
Consider a membership to a gym with a pool to allow yourself to swim a couple times a week. Work towards 150 minutes a week of physical activity.  Check your blood sugar twice a day, once fasting and once 2 hours after eating a full meal. Blood sugar goals: Fasting: under 125 After a meal: Under 180  Eat your first meal within one hour of waking. Consider greek yogurt and fruit for breakfast.  Work towards eating three meals a day, about 5-6 hours apart!  Begin to recognize carbohydrates in your food choices!  Begin to build your meals using the proportions of the Balanced Plate. First, select your carb choice(s) for the meal.  Next, select your source of protein to pair with your carb choice(s). Finally, complete the remaining half of your meal with a variety of non-starchy vegetables.

## 2021-05-09 NOTE — Progress Notes (Signed)
Diabetes Self-Management Education  Visit Type: First/Initial  Appt. Start Time: 1410 Appt. End Time: 1515  05/09/2021  [name], identified by name and date of birth, is a 45 y.o. female with a diagnosis of Diabetes: Type 2.   ASSESSMENT Pt reports working with a therapist for over a year and a half, states it is very helpful for their mental health. 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 Pt reports decreasing physical activity, increased alcohol use and poor dietary choices led to weight gain. Pt reports getting a trainer and would exercise frequently and intensely, lost a lot of weight. Pt then had a brain procedure and has gained weight back since. Pt checks FBG every few days, usually sees readings of 240-300. Has seen a highest FBG of 400. Pt reports bouts of severe hyperglycemia with BG of over 600. Pt reports symptoms of polydipsia, polyuria, slurred speech, vision disturbances, dizziness, confusion. Pt reports bouts of neuropathy. Pt reports skipping breakfast, eats a lot late at night due to food cravings from Seroquel. Pt reports their appetite is not great, loses desire to eat after food has been prepared. Pt has to force themselves to eat it. Pt walks their dogs, cleans their house for physical activity.  Pt reports severe allergy to shellfish, throat will close up.  There were no vitals taken for this visit. There is no height or weight on file to calculate BMI.   Diabetes Self-Management Education - 05/09/21 1421       Visit Information   Visit Type First/Initial      Initial Visit   Diabetes Type Type 2    Are you currently following a meal plan? No    Are you taking your medications as prescribed? Yes    Date Diagnosed Early 2022      Health Coping   How would you rate your overall health? Poor      Psychosocial Assessment   Self-management support Doctor's  office    Other persons present Patient    Patient Concerns Nutrition/Meal planning;Glycemic Control    Special Needs None    Preferred Learning Style No preference indicated    Learning Readiness Contemplating    What is the last grade level you completed in school? Partial Masters degree      Pre-Education Assessment   Patient understands the diabetes disease and treatment process. Needs Instruction    Patient understands incorporating nutritional management into lifestyle. Needs Instruction    Patient undertands incorporating physical activity into lifestyle. Needs Instruction    Patient understands using medications safely. Needs Instruction    Patient understands monitoring blood glucose, interpreting and using results Needs Instruction    Patient understands prevention, detection, and treatment of acute complications. Needs Instruction    Patient understands prevention, detection, and treatment of chronic complications. Needs Instruction    Patient understands how to develop strategies to address psychosocial issues. Needs Instruction    Patient understands how to develop strategies to promote health/change behavior. Needs Instruction      Complications   Last HgB A1C per patient/outside source 10 %   04/05/2021   How often do you check your blood sugar? 3-4 times / week    Fasting Blood glucose range (mg/dL) 106-269    Postprandial Blood glucose range (mg/dL) >485    Number of hyperglycemic episodes per week 1    Can you tell when your  blood sugar is high? Yes    What do you do if your blood sugar is high? Lay down, taken dads glipizide before    Have you had a dilated eye exam in the past 12 months? No    Have you had a dental exam in the past 12 months? No    Are you checking your feet? No      Dietary Intake   Lunch Blueberries, watermelon, cantaloupe    Snack (afternoon) Almond chips with [employer] breast, corn with butter, hashbrown casserole,  Coke, water    Snack (evening) Vanilla Ice Cream, caramel syrup    Beverage(s) Soda, Water      Exercise   Exercise Type ADL's    How many days per week to you exercise? 0    How many minutes per day do you exercise? 0    Total minutes per week of exercise 0      Patient Education   Disease state  Definition of diabetes, type 1 and 2, and the diagnosis of diabetes;Factors that contribute to the development of diabetes    Nutrition management  Food label reading, portion sizes and measuring food.;Role of diet in the treatment of diabetes and the relationship between the three main macronutrients and blood glucose level;Reviewed blood glucose goals for pre and post meals and how to evaluate the patients' food intake on their blood glucose level.;Meal options for control of blood glucose level and chronic complications.    Physical activity and exercise  Role of exercise on diabetes management, blood pressure control and cardiac health.    Monitoring Interpreting lab values - A1C, lipid, urine microalbumina.;Identified appropriate SMBG and/or A1C goals.    Acute complications Discussed and identified patients' treatment of hyperglycemia.    Chronic complications Identified and discussed with patient  current chronic complications;Relationship between chronic complications and blood glucose control;Assessed and discussed foot care and prevention of foot problems    Psychosocial adjustment Role of stress on diabetes    Personal strategies to promote health Lifestyle issues that need to be addressed for better diabetes care      Individualized Goals (developed by patient)   Nutrition Follow meal plan discussed    Physical Activity Exercise 1-2 times per week    Medications take my medication as prescribed    Monitoring  test my blood glucose as discussed;send in my blood glucose log as discussed      Post-Education Assessment   Patient understands the diabetes disease and treatment process.  Needs Review    Patient understands incorporating nutritional management into lifestyle. Needs Review    Patient undertands incorporating physical activity into lifestyle. Needs Review    Patient understands using medications safely. Needs Review    Patient understands monitoring blood glucose, interpreting and using results Needs Review    Patient understands prevention, detection, and treatment of acute complications. Needs Review    Patient understands prevention, detection, and treatment of chronic complications. Needs Review    Patient understands how to develop strategies to address psychosocial issues. Needs Review    Patient understands how to develop strategies to promote health/change behavior. Needs Review      Outcomes   Expected Outcomes Demonstrated interest in learning. Expect positive outcomes    Future DMSE 2 months    Program Status Not Completed             Individualized Plan for Diabetes Self-Management Training:   Learning Objective:  Patient  will have a greater understanding of diabetes self-management. Patient education plan is to attend individual and/or group sessions per assessed needs and concerns.   Plan:   Patient Instructions  Consider a membership to a gym with a pool to allow yourself to swim a couple times a week. Work towards 150 minutes a week of physical activity.  Check your blood sugar twice a day, once fasting and once 2 hours after eating a full meal. Blood sugar goals: Fasting: under 125 After a meal: Under 180  Eat your first meal within one hour of waking. Consider greek yogurt and fruit for breakfast.  Work towards eating three meals a day, about 5-6 hours apart!  Begin to recognize carbohydrates in your food choices!  Begin to build your meals using the proportions of the Balanced Plate. First, select your carb choice(s) for the meal.  Next, select your source of protein to pair with your carb choice(s). Finally, complete  the remaining half of your meal with a variety of non-starchy vegetables.   Expected Outcomes:  Demonstrated interest in learning. Expect positive outcomes  Education material provided: ADA - How to Thrive: A Guide for Your Journey with Diabetes and Snack sheet  If problems or questions, patient to contact team via:  Phone and Email  Future DSME appointment: 2 months

## 2021-05-16 ENCOUNTER — Other Ambulatory Visit: Payer: Self-pay

## 2021-05-16 ENCOUNTER — Ambulatory Visit: Payer: BC Managed Care – PPO | Admitting: Family Medicine

## 2021-05-16 ENCOUNTER — Encounter: Payer: Self-pay | Admitting: Family Medicine

## 2021-05-16 VITALS — BP 118/70 | HR 82 | Temp 97.1°F | Ht 66.5 in | Wt 251.6 lb

## 2021-05-16 DIAGNOSIS — N289 Disorder of kidney and ureter, unspecified: Secondary | ICD-10-CM

## 2021-05-16 DIAGNOSIS — I10 Essential (primary) hypertension: Secondary | ICD-10-CM

## 2021-05-16 DIAGNOSIS — E119 Type 2 diabetes mellitus without complications: Secondary | ICD-10-CM | POA: Diagnosis not present

## 2021-05-16 DIAGNOSIS — N3010 Interstitial cystitis (chronic) without hematuria: Secondary | ICD-10-CM | POA: Diagnosis not present

## 2021-05-16 DIAGNOSIS — Z6841 Body Mass Index (BMI) 40.0 and over, adult: Secondary | ICD-10-CM

## 2021-05-16 MED ORDER — SEMAGLUTIDE 7 MG PO TABS: 7.0000 mg | ORAL_TABLET | Freq: Every day | ORAL | 3 refills | Status: DC

## 2021-05-16 MED ORDER — RYBELSUS 3 MG PO TABS: 3.0000 mg | ORAL_TABLET | Freq: Every day | ORAL | 0 refills | Status: DC

## 2021-05-16 NOTE — Progress Notes (Signed)
[facility] [address] [place] [address] Dept: [phone] Dept Fax: [phone]  Chronic Care Office Visit  Subjective:    Patient ID: Brandi Welch, female    DOB: 1976-08-20, 45 y.o..   MRN: [id number]  Chief Complaint  Patient presents with   Follow-up    1 month f/u anxiety.  She reports feeling dizzy last couple days along with feeling foggy and not sleeping.     History of Present Illness:  Patient is in today for reassessment of chronic medical issues.  Ms. Woolen notes that she is experiencing a flare of her interstitial cystitis symptoms. This primarily involves a pressure sensation in the suprapubic area and pain. She notes that she is unable to get in to see her previous urologist, Dr. Sabino Gasser, for about a month. She requests a referral to see another urologist sooner.  Ms. Kennerson notes that she has not picked up her Rybelsus. She seems unaware that this was prescribed. She is interested in adding this to her medications for treatment of her Type 2 DM. She understands that this could also help with weight loss. Ms. Lashomb was able to get in to see a nutritionist and found this to be helpful.  Past Medical History: Patient Active Problem List   Diagnosis Date Noted   Class 3 severe obesity due to excess calories with serious comorbidity and body mass index (BMI) of 40.0 to 44.9 in adult [facility]) 02/07/2021   Bipolar disorder (HCC) 01/10/2021   Zoster 01/10/2021   Type 2 diabetes mellitus without complication, without long-term current use of insulin (HCC) 01/10/2021   Vitamin D deficiency 01/10/2021   Chronic constipation 01/10/2021   Hematochezia 01/10/2021   History of substance abuse (HCC) 05/04/2018   Hyperlipidemia 05/04/2018   AKI (acute kidney injury) (HCC) 05/04/2018   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   History of repeated overdose 10/08/2017   Chronic migraine 08/06/2017    History of opioid abuse (HCC) 01/13/2016   QT prolongation 01/11/2016   Osteoarthritis 04/12/2015   Allergic rhinitis 04/12/2015   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   Interstitial cystitis (chronic) without hematuria 06/21/2014   Generalized anxiety disorder 11/30/2012   Low back pain 01/22/2012   Myofascial pain 01/22/2012   Essential hypertension 12/18/2011   Fibromyalgia 06/27/2011   Past Surgical History:  Procedure Laterality Date   BRAIN SURGERY     CERVICAL BIOPSY  W/ LOOP ELECTRODE EXCISION     NASAL SEPTUM SURGERY     NOSE SURGERY     Family History  Problem Relation Age of Onset   Hypertension Mother    Hyperlipidemia Mother    Asthma Mother    Arthritis Mother    Diabetes Father    Hypertension Father    Hyperlipidemia Father    Stroke Maternal Grandmother    Outpatient Medications Prior to Visit  Medication Sig Dispense Refill   acetaminophen (TYLENOL) 500 MG tablet Take 1,000 mg by mouth every 6 (six) hours as needed for mild pain, moderate pain, fever or headache.     acyclovir (ZOVIRAX) 200 MG capsule Take 200 mg by mouth 2 (two) times daily.     albuterol (VENTOLIN HFA) 108 (90 Base) MCG/ACT inhaler SMARTSIG:2 Puff(s) By Mouth 4 Times Daily     atorvastatin (LIPITOR) 40 MG tablet Take 1 tablet (40 mg total) by mouth daily. 90 tablet 3   bimatoprost (LATISSE) 0.03 % ophthalmic solution Place 1 drop into both eyes 2 times daily. Place one  drop on applicator and apply evenly along the skin of the upper eyelid at base of eyelashes once daily at bedtime; repeat procedure for second eye (use a clean applicator).     cholecalciferol (VITAMIN D) 1000 units tablet Take 2,000 Units by mouth daily.     cloNIDine (CATAPRES) 0.2 MG tablet Take 0.2 mg by mouth 3 (three) times daily.     lidocaine (LIDODERM) 5 % APPLY ONCE A DAY TO AFFECTED AREAS AS NEEDED. 12 HOURS ON AND 12 HOURS OFF 30 patch 2   lisinopril (ZESTRIL) 10 MG tablet Take 1 tablet (10 mg total) by  mouth daily. 90 tablet 3   LORazepam (ATIVAN) 1 MG tablet TAKE 1 TABLET BY MOUTH 3 TIMES DAILY. 90 tablet 2   metFORMIN (GLUCOPHAGE) 500 MG tablet Take 2 tablets (1,000 mg total) by mouth 2 (two) times daily. 180 tablet 11   Multiple Vitamins-Minerals (HAIR/SKIN/NAILS) TABS Take 3-4 tablets by mouth daily.     ondansetron (ZOFRAN) 4 MG tablet TAKE 1 TABLET BY MOUTH EVERY 8 HOURS AS NEEDED FOR NAUSEA AND VOMITING 20 tablet 0   oxybutynin (DITROPAN-XL) 5 MG 24 hr tablet Take 5 mg by mouth daily.     QUEtiapine (SEROQUEL) 100 MG tablet Take 1 tablet (100 mg total) by mouth in the morning. 90 tablet 3   QUEtiapine (SEROQUEL) 400 MG tablet TAKE 1 TABLET BY MOUTH AT BEDTIME 90 tablet 3   Semaglutide (RYBELSUS) 3 MG TABS Take 3 mg by mouth daily at 12 noon. 30 tablet 0   No facility-administered medications prior to visit.   Allergies  Allergen Reactions   Shellfish Allergy Shortness Of Breath   Naloxone Diarrhea and Nausea And Vomiting   Objective:   Today's Vitals   05/16/21 1439  BP: 118/70  Pulse: 82  Temp: (!) 97.1 F (36.2 C)  TempSrc: Temporal  SpO2: 95%  Weight: 251 lb 9.6 oz (114.1 kg)  Height: 5' 6.5" (1.689 m)   Body mass index is 40 kg/m.   General: Well developed, well nourished. No acute distress. Psych: Alert and oriented x3. Normal mood and affect.  Health Maintenance Due  Topic Date Due   PNEUMOCOCCAL POLYSACCHARIDE VACCINE AGE 63-64 HIGH RISK  Never done   Pneumococcal Vaccine 61-45 Years old (1 - PCV) Never done   FOOT EXAM  Never done   OPHTHALMOLOGY EXAM  Never done   TETANUS/TDAP  Never done   PAP SMEAR-Modifier  Never done   COLONOSCOPY (Pts 45-33yrs Insurance coverage will need to be confirmed)  Never done     Lab results: Lab Results  Component Value Date   HGBA1C 10.0 (H) 04/11/2021   Assessment & Plan:   1. Type 2 diabetes mellitus without complication, without long-term current use of insulin (HCC) We discussed the addition of semaglutide to  her diabetes regimen. Although she has had some ongoing weight loss, I suspect this has been due to uncontrolled diabetes. We will plan to reassess her HbA1c in 2 months.  - Semaglutide (RYBELSUS) 3 MG TABS; Take 3 mg by mouth daily at 12 noon.  Dispense: 30 tablet; Refill: 0 - Semaglutide 7 MG TABS; Take 7 mg by mouth daily at 12 noon.  Dispense: 90 tablet; Refill: 3  2. Essential hypertension Blood pressure is much improved today. We had stopped clonidine and started lisinopril at her last visit. She is tolerating this well. We will check a BMP to assure renal function is stable.  - Basic metabolic panel  3.  Class 3 severe obesity due to excess calories with serious comorbidity and body mass index (BMI) of 40.0 to 44.9 in adult [facility]) Seeing nutritionist. Weight is going down, but may be due to uncontrolled diabetes. We will monitor this as we get her blood sugars in control.  4. Interstitial cystitis (chronic) without hematuria Recommend we check a urinalysis to make sure there is not a UTI. I will see if we can get her in with another urologist sooner.  - Urinalysis w microscopic + reflex cultur - Ambulatory referral to Urology  Loyola Mast, MD

## 2021-05-17 LAB — URINALYSIS W MICROSCOPIC + REFLEX CULTURE
Bacteria, UA: NONE SEEN /HPF
Bilirubin Urine: NEGATIVE
Glucose, UA: NEGATIVE
Hgb urine dipstick: NEGATIVE
Hyaline Cast: NONE SEEN /LPF
Ketones, ur: NEGATIVE
Leukocyte Esterase: NEGATIVE
Nitrites, Initial: NEGATIVE
Protein, ur: NEGATIVE
RBC / HPF: NONE SEEN /HPF (ref 0–2)
Specific Gravity, Urine: 1.014 (ref 1.001–1.035)
Squamous Epithelial / HPF: NONE SEEN /HPF (ref <=5)
pH: 5.5 (ref 5.0–8.0)

## 2021-05-17 LAB — BASIC METABOLIC PANEL
BUN: 29 mg/dL — ABNORMAL HIGH (ref 6–23)
CO2: 29 mEq/L (ref 19–32)
Calcium: 9.8 mg/dL (ref 8.4–10.5)
Chloride: 100 mEq/L (ref 96–112)
Creatinine, Ser: 1.67 mg/dL — ABNORMAL HIGH (ref 0.40–1.20)
GFR: 36.69 mL/min — ABNORMAL LOW (ref >60.00)
Glucose, Bld: 135 mg/dL — ABNORMAL HIGH (ref 70–99)
Potassium: 5.9 mEq/L — ABNORMAL HIGH (ref 3.5–5.1)
Sodium: 137 mEq/L (ref 135–145)

## 2021-05-17 LAB — NO CULTURE INDICATED

## 2021-05-18 MED ORDER — TELMISARTAN 20 MG PO TABS: 20.0000 mg | ORAL_TABLET | Freq: Every day | ORAL | 3 refills | Status: DC

## 2021-05-18 NOTE — Addendum Note (Signed)
Addended by: Loyola Mast on: 05/18/2021 08:26 AM   Modules accepted: Orders

## 2021-05-22 ENCOUNTER — Telehealth: Payer: Self-pay | Admitting: Family Medicine

## 2021-05-22 NOTE — Telephone Encounter (Signed)
Pt states that she was in error. She has been taking Semaglutide (RYBELSUS) 3 MG TABS for approx 1 month prior to 05/16/21 OV. She didn't realize she filled the med the month prior.   Pt notes still having stomach issues and not getting much sleep. Can Dr. Veto Kemps refill ondansetron (ZOFRAN) 4 MG tablet for nausea & possibly something for sleep. Pt remembers trying trazadone in the past but didn't have success. Please advise.  Ph# [phone]  [id number] - [place], [place] - [address] AT [facility] [address] Phone:  [phone]  Fax:  [phone]

## 2021-05-22 NOTE — Telephone Encounter (Signed)
Spoke to patient, she states that she will start taking the 7 mg Semiglutide and still has a couple tablets of the Zofran left.  The sleeping issue is still going on since last OV and she states that she has taken Melatonin, Unisom, Trazodone and Benadryl with no relief.   Advised that provider was out of the office till 05/24/21 and that message would be sent to them to review upon returning.  Patient agreeable.    Please review and advise.  Thanks.  Dm/cma

## 2021-05-24 NOTE — Telephone Encounter (Signed)
Lft VM to rtn call to advise that she needs an OV. Dm/cma

## 2021-05-28 NOTE — Telephone Encounter (Signed)
Spoke to patient and advised she needed an appt.  Schedule appt for 06/04/21.  Pt agreeable.  Dm/cma

## 2021-06-04 ENCOUNTER — Ambulatory Visit: Payer: BC Managed Care – PPO | Admitting: Family Medicine

## 2021-06-12 ENCOUNTER — Encounter: Payer: Self-pay | Admitting: Physical Medicine & Rehabilitation

## 2021-06-12 ENCOUNTER — Other Ambulatory Visit: Payer: Self-pay

## 2021-06-12 ENCOUNTER — Encounter
Payer: BC Managed Care – PPO | Attending: Physical Medicine & Rehabilitation | Admitting: Physical Medicine & Rehabilitation

## 2021-06-12 VITALS — BP 152/91 | HR 148 | Temp 99.1°F | Ht 66.5 in | Wt 237.4 lb

## 2021-06-12 DIAGNOSIS — G43709 Chronic migraine without aura, not intractable, without status migrainosus: Secondary | ICD-10-CM

## 2021-06-12 DIAGNOSIS — M797 Fibromyalgia: Secondary | ICD-10-CM

## 2021-06-12 NOTE — Patient Instructions (Signed)
Start doing more stretching even before you start PT   Call Dr Veto Kemps about elevated HR

## 2021-06-12 NOTE — Progress Notes (Signed)
Subjective:    Patient ID: Brandi Welch, female    DOB: 03/03/1976, 45 y.o.   MRN: [id number]  HPI Chief complaint is widespread body pain 45 year old female diagnosed with fibromyalgia in 2008.  Seen by Dr. Riley Welch in 2010 through 2013.  The patient states she had benefit from trigger point injections at that time. The patient also has been dealing with lower abdominal pain associated with interstitial cystitis and follows up with urology for this.  She has discussed obtaining a referral to a subspecialist, Dr. Marcelyn Welch for consultation. The patient also has history of headaches and has mentioned being diagnosed with migraine headaches tension headaches cluster headaches.  She currently does not have a neurologist. She also states she has been diagnosed with trigeminal neuralgia but describes pain that involves the temporal parietal area rather than facial. Past medical history is significant for substance abuse as well as bipolar disorder.  Has history of morbid obesity, hyperlipidemia, diabetes. Patient also has a history of anxiety attack as well as palpitations.  She currently is having palpitations.  She has no chest pain or shortness of breath. Functionally the patient is independent she is not Pain Inventory Average Pain 9 Pain Right Now 9 My pain is sharp, stabbing, and aching  In the last 24 hours, has pain interfered with the following? General activity 10 Relation with others 9 Enjoyment of life 9 What TIME of day is your pain at its worst? morning , evening, and night Sleep (in general) NA  Pain is worse with: sitting and inactivity Pain improves with: medication Relief from Meds:  not answered  walk without assistance how many minutes can you walk? 10  not employed: date last employed .  bladder control problems anxiety  Any changes since last visit?  no  Any changes since last visit?  no Primary care Brandi Drape MD    Family History  Problem  Relation Age of Onset   Hypertension Mother    Hyperlipidemia Mother    Asthma Mother    Arthritis Mother    Diabetes Father    Hypertension Father    Hyperlipidemia Father    Stroke Maternal Grandmother    Social History   Socioeconomic History   Marital status: Divorced    Spouse name: Not on file   Number of children: Not on file   Years of education: Not on file   Highest education level: Not on file  Occupational History   Not on file  Tobacco Use   Smoking status: Some Days    Years: 0.50    Types: Cigarettes   Smokeless tobacco: Never  Vaping Use   Vaping Use: Never used  Substance and Sexual Activity   Alcohol use: No   Drug use: Not Currently    Comment: Pain meds -- Percoset and Valium   Sexual activity: Not Currently    Birth control/protection: Condom  Other Topics Concern   Not on file  Social History Narrative   Not on file   Social Determinants of Health   Financial Resource Strain: Not on file  Food Insecurity: Not on file  Transportation Needs: Not on file  Physical Activity: Not on file  Stress: Not on file  Social Connections: Not on file   Past Surgical History:  Procedure Laterality Date   BRAIN SURGERY     CERVICAL BIOPSY  W/ LOOP ELECTRODE EXCISION     NASAL SEPTUM SURGERY     NOSE SURGERY  Past Medical History:  Diagnosis Date   Anxiety    Arthritis    Baclofen overdose 01/03/2018   Benzodiazepine overdose 01/11/2016   Cocaine intoxication (HCC) 05/04/2018   Fibromyalgia    Hypothyroidism 03/12/2012   IC (interstitial cystitis)    Knee pain, chronic    Opioid overdose (HCC) 01/11/2016   Poisoning by tricyclic antidepressants 01/11/2016   Ulcer    BP (!) 152/91   Pulse (!) 148   Temp 99.1 F (37.3 C)   Ht 5' 6.5" (1.689 m)   Wt 237 lb 6.4 oz (107.7 kg)   SpO2 96%   BMI 37.74 kg/m   Opioid Risk Score:   Fall Risk Score:  `1  Depression screen PHQ 2/9  Depression screen [facility] 2/9 06/12/2021 05/09/2021 01/10/2021  Decreased  Interest 2 0 2  Down, Depressed, Hopeless 2 1 1   PHQ - 2 Score 4 1 3   Altered sleeping 3 - 1  Tired, decreased energy 2 - 3  Change in appetite 2 - 1  Feeling bad or failure about yourself  2 - 3  Trouble concentrating 2 - 2  Moving slowly or fidgety/restless 2 - 1  Suicidal thoughts 2 - 0  PHQ-9 Score 19 - 14  Difficult doing work/chores - - Somewhat difficult     Review of Systems  Constitutional: Negative.   HENT: Negative.    Eyes: Negative.   Respiratory: Negative.    Cardiovascular: Negative.   Gastrointestinal:  Positive for nausea and vomiting.  Endocrine: Negative.   Genitourinary:  Positive for dysuria.  Musculoskeletal:  Positive for myalgias.  Skin: Negative.   Allergic/Immunologic: Negative.   Neurological: Negative.        Trigeminal cephalgia autonomic  Hematological: Negative.   Psychiatric/Behavioral:  Positive for dysphoric mood. The patient is nervous/anxious.        Hx bipolar  All other systems reviewed and are negative.     Objective:   Physical Exam Vitals and nursing note reviewed.  Constitutional:      Appearance: She is obese.  HENT:     Head: Normocephalic and atraumatic.  Eyes:     Extraocular Movements: Extraocular movements intact.     Conjunctiva/sclera: Conjunctivae normal.     Pupils: Pupils are equal, round, and reactive to light.  Cardiovascular:     Rate and Rhythm: Regular rhythm. Tachycardia present.     Pulses: Normal pulses.     Heart sounds: Normal heart sounds. No murmur heard. Pulmonary:     Effort: Pulmonary effort is normal. No respiratory distress.     Breath sounds: Normal breath sounds.  Abdominal:     General: Abdomen is flat. Bowel sounds are normal. There is no distension.     Palpations: Abdomen is soft.     Tenderness: There is abdominal tenderness.     Comments: Suprapubic tenderness  Musculoskeletal:     Cervical back: Normal range of motion.  Neurological:     Mental Status: She is alert.   Psychiatric:        Attention and Perception: Attention and perception normal.        Mood and Affect: Mood is anxious.        Speech: Speech is rapid and pressured.        Behavior: Behavior is cooperative.        Thought Content: Thought content normal.        Cognition and Memory: Cognition and memory normal.        Judgment:  Judgment normal.   Motor strength is 5/5 bilateral deltoid, bicep, tricep, grip, hip flexor, knee extensor, dorsiflexor plantar flexor Negative straight leg raising bilaterally There is tenderness palpation in bilateral upper trapezius as well as thoracic and lumbar paraspinal areas Tenderness over the bilateral greater trochanters as well.  Patient has normal range of motion in the lumbar thoracic and cervical spine as well as upper and lower extremities.     Assessment & Plan:   1.  Fibromyalgia syndrome, she used to enjoy swimming but no longer does this mainly due to finances.  It has been quite a while since she has been exercising, will make referral to aquatic therapy The patient was advanced to major in college she is quite flexible although not as flexible as she once was according to her.  I asked her to start doing her stretching exercises which she is quite familiar with.  The patient will follow-up with Brandi Welch at this clinic, has trigger point injections scheduled for the end of the month. 2.  Palpitations history of prolonged QT interval, she is on Seroquel.  Certainly may be associated with her history of anxiety as well.  Asked her to contact her primary care physician about this.  3.  Interstitial cystitis recommend subspecialist urologist Dr. Marcelyn Welch.  The patient has already discussed this with her regular urologist.

## 2021-06-13 ENCOUNTER — Telehealth: Payer: Self-pay

## 2021-06-13 ENCOUNTER — Encounter: Payer: Self-pay | Admitting: Neurology

## 2021-06-13 NOTE — Telephone Encounter (Signed)
Spoke to patient,she advised me that her Bpwas elevated yesterday 151/92 w/ HR 148.  She was advised to go to the ER by them and to call us to let us know.  She went home instead.  She is still feeling light headed, &  dizzy.  No BP taken due doesn't have a machine.  She will see if her neighbor has one to check it.  Due to felling dizzy & light headed still, advised she should see if someone can drive her to the hospital to be checked out.  She will see if she can call a friend to take her. Dm/cma

## 2021-06-14 NOTE — Telephone Encounter (Signed)
Lft VM to rtn call. Dm/cma  

## 2021-06-15 NOTE — Telephone Encounter (Signed)
Lft VM to rtn call. Dm/cma  

## 2021-06-30 ENCOUNTER — Other Ambulatory Visit: Payer: Self-pay | Admitting: Family Medicine

## 2021-06-30 DIAGNOSIS — F319 Bipolar disorder, unspecified: Secondary | ICD-10-CM

## 2021-06-30 DIAGNOSIS — N289 Disorder of kidney and ureter, unspecified: Secondary | ICD-10-CM

## 2021-07-04 ENCOUNTER — Encounter: Payer: BC Managed Care – PPO | Admitting: Physical Medicine and Rehabilitation

## 2021-07-11 ENCOUNTER — Encounter: Payer: BC Managed Care – PPO | Admitting: Dietician

## 2021-07-16 ENCOUNTER — Other Ambulatory Visit: Payer: Self-pay

## 2021-07-16 ENCOUNTER — Ambulatory Visit: Payer: BC Managed Care – PPO | Admitting: Dietician

## 2021-07-17 ENCOUNTER — Ambulatory Visit: Payer: BC Managed Care – PPO | Admitting: Family Medicine

## 2021-07-17 ENCOUNTER — Encounter: Payer: Self-pay | Admitting: Family Medicine

## 2021-07-17 VITALS — BP 102/70 | HR 129 | Temp 98.3°F | Ht 66.5 in | Wt 240.4 lb

## 2021-07-17 DIAGNOSIS — F319 Bipolar disorder, unspecified: Secondary | ICD-10-CM | POA: Diagnosis not present

## 2021-07-17 DIAGNOSIS — N3010 Interstitial cystitis (chronic) without hematuria: Secondary | ICD-10-CM

## 2021-07-17 DIAGNOSIS — F5101 Primary insomnia: Secondary | ICD-10-CM

## 2021-07-17 DIAGNOSIS — E119 Type 2 diabetes mellitus without complications: Secondary | ICD-10-CM | POA: Diagnosis not present

## 2021-07-17 DIAGNOSIS — Z6841 Body Mass Index (BMI) 40.0 and over, adult: Secondary | ICD-10-CM

## 2021-07-17 DIAGNOSIS — K219 Gastro-esophageal reflux disease without esophagitis: Secondary | ICD-10-CM | POA: Insufficient documentation

## 2021-07-17 DIAGNOSIS — B009 Herpesviral infection, unspecified: Secondary | ICD-10-CM

## 2021-07-17 DIAGNOSIS — I10 Essential (primary) hypertension: Secondary | ICD-10-CM | POA: Diagnosis not present

## 2021-07-17 LAB — HEMOGLOBIN A1C: Hgb A1c MFr Bld: 7.7 % — ABNORMAL HIGH (ref 4.6–6.5)

## 2021-07-17 MED ORDER — ACYCLOVIR 200 MG PO CAPS: 200.0000 mg | ORAL_CAPSULE | Freq: Two times a day (BID) | ORAL | 3 refills | Status: DC

## 2021-07-17 MED ORDER — ZOLPIDEM TARTRATE 5 MG PO TABS: 5.0000 mg | ORAL_TABLET | Freq: Every evening | ORAL | 1 refills | Status: DC | PRN

## 2021-07-17 MED ORDER — ESOMEPRAZOLE MAGNESIUM 40 MG PO CPDR: 40.0000 mg | DELAYED_RELEASE_CAPSULE | Freq: Two times a day (BID) | ORAL | 3 refills | Status: DC
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
## 2021-07-18 ENCOUNTER — Other Ambulatory Visit: Payer: BC Managed Care – PPO

## 2021-07-19 ENCOUNTER — Other Ambulatory Visit (INDEPENDENT_AMBULATORY_CARE_PROVIDER_SITE_OTHER): Payer: BC Managed Care – PPO

## 2021-07-19 ENCOUNTER — Other Ambulatory Visit: Payer: Self-pay

## 2021-07-19 DIAGNOSIS — I10 Essential (primary) hypertension: Secondary | ICD-10-CM

## 2021-07-19 NOTE — Progress Notes (Signed)
Per the orders of Dr.rudd pt is here for labs pt tolerated draw well.  °

## 2021-07-20 ENCOUNTER — Telehealth: Payer: Self-pay | Admitting: Family Medicine

## 2021-07-20 LAB — BASIC METABOLIC PANEL
BUN: 13 mg/dL (ref 6–23)
CO2: 25 mEq/L (ref 19–32)
Calcium: 9.5 mg/dL (ref 8.4–10.5)
Chloride: 99 mEq/L (ref 96–112)
Creatinine, Ser: 0.99 mg/dL (ref 0.40–1.20)
GFR: 68.63 mL/min (ref >60.00)
Glucose, Bld: 112 mg/dL — ABNORMAL HIGH (ref 70–99)
Potassium: 4.6 mEq/L (ref 3.5–5.1)
Sodium: 137 mEq/L (ref 135–145)

## 2021-07-20 NOTE — Telephone Encounter (Signed)
Pt called saying she does want to start on the Ozempic

## 2021-07-23 NOTE — Telephone Encounter (Signed)
Lft VM that will forward message to provider to advise once back on Thursday.   She wants to start the Ozempic. Please review and advise. Thanks.  Dm/cma

## 2021-07-25 ENCOUNTER — Telehealth: Payer: Self-pay | Admitting: Family Medicine

## 2021-07-25 NOTE — Telephone Encounter (Signed)
Message already sent to Dr Veto Kemps. Dm/cma

## 2021-07-25 NOTE — Telephone Encounter (Signed)
Spoke to patient and she would like to the pill for of Ozempic not injections.  Dm/cma

## 2021-07-26 NOTE — Telephone Encounter (Signed)
Lft VM to rtn call. Dm/cma  

## 2021-07-31 NOTE — Telephone Encounter (Signed)
Lft VM to rtn call. Dm/cma  

## 2021-08-01 ENCOUNTER — Ambulatory Visit: Payer: BC Managed Care – PPO | Admitting: Family Medicine

## 2021-08-03 ENCOUNTER — Other Ambulatory Visit: Payer: Self-pay | Admitting: Family Medicine

## 2021-08-03 DIAGNOSIS — F411 Generalized anxiety disorder: Secondary | ICD-10-CM

## 2021-08-13 ENCOUNTER — Other Ambulatory Visit: Payer: Self-pay | Admitting: Family Medicine

## 2021-08-13 DIAGNOSIS — N289 Disorder of kidney and ureter, unspecified: Secondary | ICD-10-CM

## 2021-08-20 ENCOUNTER — Ambulatory Visit: Payer: BC Managed Care – PPO | Admitting: Family Medicine

## 2021-08-31 NOTE — Progress Notes (Deleted)
NEUROLOGY CONSULTATION NOTE  [name] MRN: [id number] DOB: 1976/02/06  Referring provider: Claudette Laws, MD Primary care provider: [name], MD  Reason for consult:  migraines  Assessment/Plan:   ***   Subjective:  Brandi Welch is a 45 year old female with fibromyalgia, anxiety, history of prolonged QT interval and history of cocaine use and overdose to baclofen, opioids and TCAs who presents for migraines.  History supplemented by referring provider's note.  Onset:  *** Location:  *** Quality:  *** Intensity:  ***.  *** denies new headache, thunderclap headache or severe headache that wakes *** from sleep. Aura:  *** Prodrome:  *** Postdrome:  *** Associated symptoms:  ***.  *** denies associated unilateral numbness or weakness. Duration:  *** Frequency:  *** Frequency of abortive medication: *** Triggers:  *** Relieving factors:  *** Activity:  ***  History of fibromyalgia, cervicalgia, right sided trigeminal neuralgia, bilateral occipital neuralgia  07/02/2018 CT HEAD W:  1. No CT evidence of an acute intracranial abnormality or mass lesion.  2. 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 08/19/2017 MRI BRAIN WO:  1.  Normal unenhanced MRI of the brain.  2.  The right superior cerebellar artery comes in close proximity to the cisternal segment of the right trigeminal nerve, which could be a source of the patient's reported right-sided trigeminal neuralgia. 08/03/2017 CERVICAL SPINE X-RAY 2-3 VIEWS (personally reviewed):  mild reversal of the normal cervical lordosis.  Otherwise, normal appearing bones. Normal appearing airway. No visible soft tissue abnormality. No soft tissue gas. Metallic dental work.  Current NSAIDS/analgesics:  acetaminophen, Lidoderm 5% Current triptans:  *** Current ergotamine:  *** Current anti-emetic:  Zofran 4mg  Current muscle relaxants:  *** Current Antihypertensive medications:   clonidine, telmisartan Current Antidepressant/antipsychotic medications:  quetiapine Current Anticonvulsant medications:  *** Current anti-CGRP:  *** Current Vitamins/Herbal/Supplements:  D Current Antihistamines/Decongestants:  *** Other therapy:  *** Hormone/birth control:  *** Other medications:  lorazepam, zolpidem  Past NSAIDS/analgesics:  hydrocodone, oxycodone, tramadol Past abortive triptans:  *** Past abortive ergotamine:  *** Past muscle relaxants:  baclofen (overdose) Past anti-emetic:  promethazine Past antihypertensive medications:  lisinopril Past antidepressant medications:  nortriptyline Past anticonvulsant medications:  gabapentin, pregablin Past anti-CGRP:  *** Past vitamins/Herbal/Supplements:  Coenzyme Q10, magnesium, B12, D, A,MVI Past antihistamines/decongestants:  hydroxyzine Other past therapies:  ***  Caffeine:  *** Alcohol:  *** Smoker:  *** Diet:  *** Exercise:  *** Depression:  ***; Anxiety:  *** Other pain:  fibromyalgia Sleep hygiene:  *** Family history of headache:  ***      PAST MEDICAL HISTORY: Past Medical History:  Diagnosis Date   Anxiety    Arthritis    Baclofen overdose 01/03/2018   Benzodiazepine overdose 01/11/2016   Cocaine intoxication (HCC) 05/04/2018   Fibromyalgia    Hypothyroidism 03/12/2012   IC (interstitial cystitis)    Knee pain, chronic    Opioid overdose (HCC) 01/11/2016   Poisoning by tricyclic antidepressants 01/11/2016   Ulcer     PAST SURGICAL HISTORY: Past Surgical History:  Procedure Laterality Date   BRAIN SURGERY     CERVICAL BIOPSY  W/ LOOP ELECTRODE EXCISION     NASAL SEPTUM SURGERY     NOSE SURGERY      MEDICATIONS: Current Outpatient Medications on File Prior to Visit  Medication Sig Dispense Refill   acetaminophen (TYLENOL) 500 MG tablet Take 1,000 mg by mouth every 6 (six) hours as needed for mild pain, moderate pain, fever or headache.  acyclovir (ZOVIRAX) 200 MG capsule Take 1 capsule (200 mg  total) by mouth 2 (two) times daily. 60 capsule 3   albuterol (VENTOLIN HFA) 108 (90 Base) MCG/ACT inhaler SMARTSIG:2 Puff(s) By Mouth 4 Times Daily     atorvastatin (LIPITOR) 40 MG tablet Take 1 tablet (40 mg total) by mouth daily. 90 tablet 3   bimatoprost (LATISSE) 0.03 % ophthalmic solution Place 1 drop into both eyes 2 times daily. Place one drop on applicator and apply evenly along the skin of the upper eyelid at base of eyelashes once daily at bedtime; repeat procedure for second eye (use a clean applicator).     cholecalciferol (VITAMIN D) 1000 units tablet Take 2,000 Units by mouth daily.     cloNIDine (CATAPRES) 0.2 MG tablet Take 0.2 mg by mouth 3 (three) times daily.     esomeprazole (NEXIUM) 40 MG capsule Take 1 capsule (40 mg total) by mouth 2 (two) times daily before a meal. 60 capsule 3   lidocaine (LIDODERM) 5 % APPLY ONCE A DAY TO AFFECTED AREAS AS NEEDED. 12 HOURS ON AND 12 HOURS OFF 30 patch 2   LORazepam (ATIVAN) 1 MG tablet TAKE 1 TABLET BY MOUTH THREE TIMES A DAY 90 tablet 0   metFORMIN (GLUCOPHAGE) 500 MG tablet Take 2 tablets (1,000 mg total) by mouth 2 (two) times daily. 180 tablet 11   Multiple Vitamins-Minerals (HAIR/SKIN/NAILS) TABS Take 3-4 tablets by mouth daily.     ondansetron (ZOFRAN) 4 MG tablet TAKE 1 TABLET BY MOUTH EVERY 8 HOURS AS NEEDED FOR NAUSEA AND VOMITING 20 tablet 0   oxybutynin (DITROPAN-XL) 5 MG 24 hr tablet Take 5 mg by mouth daily.     Plecanatide 3 MG TABS Take by mouth.     promethazine (PHENERGAN) 25 MG tablet      QUEtiapine (SEROQUEL XR) 400 MG 24 hr tablet TAKE 1 TABLET BY MOUTH AT BEDTIME 90 tablet 3   QUEtiapine (SEROQUEL) 100 MG tablet Take 1 tablet (100 mg total) by mouth in the morning. 90 tablet 3   QUEtiapine (SEROQUEL) 400 MG tablet TAKE 1 TABLET BY MOUTH AT BEDTIME 90 tablet 3   [START ON 05/17/2022] Semaglutide 7 MG TABS Take 7 mg by mouth daily at 12 noon. 90 tablet 3   telmisartan (MICARDIS) 20 MG tablet TAKE 1 TABLET BY MOUTH  EVERY DAY 90 tablet 3   zolpidem (AMBIEN) 5 MG tablet Take 1 tablet (5 mg total) by mouth at bedtime as needed for sleep. 15 tablet 1   No current facility-administered medications on file prior to visit.    ALLERGIES: Allergies  Allergen Reactions   Shellfish Allergy Shortness Of Breath   Naloxone Diarrhea and Nausea And Vomiting    FAMILY HISTORY: Family History  Problem Relation Age of Onset   Hypertension Mother    Hyperlipidemia Mother    Asthma Mother    Arthritis Mother    Diabetes Father    Hypertension Father    Hyperlipidemia Father    Stroke Maternal Grandmother     Objective:  *** General: No acute distress.  Patient appears well-groomed.   Head:  Normocephalic/atraumatic Eyes:  fundi examined but not visualized Neck: supple, no paraspinal tenderness, full range of motion Back: No paraspinal tenderness Heart: regular rate and rhythm Lungs: Clear to auscultation bilaterally. Vascular: No carotid bruits. Neurological Exam: Mental status: alert and oriented to person, place, and time, recent and remote memory intact, fund of knowledge intact, attention and concentration intact, speech fluent  and not dysarthric, language intact. Cranial nerves: CN I: not tested CN II: pupils equal, round and reactive to light, visual fields intact CN III, IV, VI:  full range of motion, no nystagmus, no ptosis CN V: facial sensation intact. CN VII: upper and lower face symmetric CN VIII: hearing intact CN IX, X: gag intact, uvula midline CN XI: sternocleidomastoid and trapezius muscles intact CN XII: tongue midline Bulk & Tone: normal, no fasciculations. Motor:  muscle strength 5/5 throughout Sensation:  Pinprick, temperature and vibratory sensation intact. Deep Tendon Reflexes:  2+ throughout,  toes downgoing.   Finger to nose testing:  Without dysmetria.   Heel to shin:  Without dysmetria.   Gait:  Normal station and stride.  Romberg negative.    Thank you for  allowing me to take part in the care of this patient.  Shon Millet, DO  CC: ***

## 2021-09-03 ENCOUNTER — Ambulatory Visit: Payer: BC Managed Care – PPO | Admitting: Neurology

## 2021-09-03 ENCOUNTER — Telehealth: Payer: Self-pay | Admitting: Family Medicine

## 2021-09-03 ENCOUNTER — Other Ambulatory Visit: Payer: Self-pay | Admitting: Family

## 2021-09-03 DIAGNOSIS — F411 Generalized anxiety disorder: Secondary | ICD-10-CM

## 2021-09-03 MED ORDER — LORAZEPAM 1 MG PO TABS: 1.0000 mg | ORAL_TABLET | Freq: Three times a day (TID) | ORAL | 0 refills | Status: DC

## 2021-09-03 NOTE — Telephone Encounter (Signed)
Refill request for: Lorazepam 1 mg LR 08/06/21, #90. 0 rf LOV 07/17/21 FOV  10/10/21  Please review and advise.   Thanks.  Dm/cma

## 2021-09-03 NOTE — Telephone Encounter (Signed)
What is the name of the medication? LORazepam (ATIVAN) 1 MG tablet [[id number]]   Have you contacted your pharmacy to request a refill? Pt is needing a new written script for this med.  Which pharmacy would you like this sent to? [id number] - [place], [place] - [address] AT [facility] [address]  [address] [place] [address]  Phone:  [phone]  Fax:  [phone]  DEA #:  [id number]   Patient notified that their request is being sent to the clinical staff for review and that they should receive a call once it is complete. If they do not receive a call within 72 hours they can check with their pharmacy or our office.

## 2021-09-04 ENCOUNTER — Other Ambulatory Visit: Payer: Self-pay | Admitting: Family Medicine

## 2021-09-04 DIAGNOSIS — B009 Herpesviral infection, unspecified: Secondary | ICD-10-CM

## 2021-09-04 DIAGNOSIS — M797 Fibromyalgia: Secondary | ICD-10-CM

## 2021-10-03 ENCOUNTER — Telehealth: Payer: Self-pay | Admitting: Family Medicine

## 2021-10-03 DIAGNOSIS — F5101 Primary insomnia: Secondary | ICD-10-CM

## 2021-10-03 DIAGNOSIS — F411 Generalized anxiety disorder: Secondary | ICD-10-CM

## 2021-10-03 MED ORDER — ZOLPIDEM TARTRATE 5 MG PO TABS: 5.0000 mg | ORAL_TABLET | Freq: Every evening | ORAL | 1 refills | Status: DC | PRN

## 2021-10-03 MED ORDER — LORAZEPAM 1 MG PO TABS: 1.0000 mg | ORAL_TABLET | Freq: Three times a day (TID) | ORAL | 1 refills | Status: DC

## 2021-10-03 NOTE — Telephone Encounter (Signed)
Refill request for:  Brandi Welch 1 mg LR 08/06/21, #90, 0 rf  Ambien 10 mg LR 07/17/21, #15, 1 rf LOV 07/17/21 FOV 10/10/21  Please review and advise.  Thanks.  Dm/cma

## 2021-10-04 NOTE — Telephone Encounter (Signed)
Lft detailed VM that RX was sent to the pharmacy. Dm/cma  

## 2021-10-10 ENCOUNTER — Other Ambulatory Visit: Payer: Self-pay

## 2021-10-10 ENCOUNTER — Ambulatory Visit: Payer: BC Managed Care – PPO | Admitting: Family Medicine

## 2021-10-10 VITALS — BP 116/72 | HR 85 | Temp 97.6°F | Ht 66.5 in | Wt 246.2 lb

## 2021-10-10 DIAGNOSIS — I10 Essential (primary) hypertension: Secondary | ICD-10-CM

## 2021-10-10 DIAGNOSIS — K219 Gastro-esophageal reflux disease without esophagitis: Secondary | ICD-10-CM

## 2021-10-10 DIAGNOSIS — E1140 Type 2 diabetes mellitus with diabetic neuropathy, unspecified: Secondary | ICD-10-CM | POA: Insufficient documentation

## 2021-10-10 DIAGNOSIS — E1142 Type 2 diabetes mellitus with diabetic polyneuropathy: Secondary | ICD-10-CM | POA: Diagnosis not present

## 2021-10-10 DIAGNOSIS — R1110 Vomiting, unspecified: Secondary | ICD-10-CM

## 2021-10-10 DIAGNOSIS — J069 Acute upper respiratory infection, unspecified: Secondary | ICD-10-CM

## 2021-10-10 MED ORDER — ACCU-CHEK SOFTCLIX LANCETS MISC: 12 refills | Status: AC

## 2021-10-10 NOTE — Progress Notes (Signed)
[facility] [address] [place] [address] Dept: [phone] Dept Fax: [phone]  Chronic Care Office Visit  Subjective:    Patient ID: Brandi Welch, female    DOB: Dec 20, 1975, 45 y.o..   MRN: [id number]  Chief Complaint  Patient presents with   Follow-up    3 month f/u, c/o having congestion/cough x 1 day.  Declines flu shot.     History of Present Illness:  Patient is in today for reassessment of chronic medical issues.  Brandi Welch notes that she has had some cough and chest congestion over the past day. She has not taken any medication for this at this point.  Brandi Welch notes continued issues with GI symptoms. She has been experiencing periodic nausea, bloating, acid reflux, and upper abdominal discomfort since July. She stopped taking Nexium and seems to think she was not on this medicine. She is out of her Zofran. She had been referred to GI for these issues and due to a complaint of hematochezia. She notes that the gastroenterologist had planned an EGD and colonoscopy. However, she had a fear related to drinking the bowel prep, as she had vomiting with this int he past. She never followed through with the exam. Despite this, she notes she has had an increased appetite more recently and has noted some weight gain.   Brandi Welch has a history of Type 2 DM. She is currently managed on metformin and Rybelsus. The Rybelsus was added in July, as her A1c was increased in June. She notes improved blood sugars at home. She does not feel that the Rybelsus is causing her GI complaints and wants to continue on this.   Brandi Welch has a history of hypertension. She is currently managed on telmisartan.  Past Medical History: Patient Active Problem List   Diagnosis Date Noted   Gastroesophageal reflux disease without esophagitis 07/17/2021   Class 3 severe obesity due to excess calories with serious comorbidity and  body mass index (BMI) of 40.0 to 44.9 in adult ([place]) 02/07/2021   Bipolar disorder ([place]) 01/10/2021   Zoster 01/10/2021   Type 2 diabetes mellitus without complication, without long-term current use of insulin ([place]) 01/10/2021   Vitamin D deficiency 01/10/2021   Chronic constipation 01/10/2021   Hematochezia 01/10/2021   History of substance abuse ([place]) 05/04/2018   Hyperlipidemia 05/04/2018   AKI (acute kidney injury) ([place]) 05/04/2018   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   History of repeated overdose 10/08/2017   Chronic migraine 08/06/2017   History of opioid abuse ([place]) 01/13/2016   QT prolongation 01/11/2016   Osteoarthritis 04/12/2015   Allergic rhinitis 04/12/2015   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   Interstitial cystitis (chronic) without hematuria 06/21/2014   Generalized anxiety disorder 11/30/2012   Low back pain 01/22/2012   Myofascial pain 01/22/2012   Essential hypertension 12/18/2011   Fibromyalgia 06/27/2011   Past Surgical History:  Procedure Laterality Date   BRAIN SURGERY     CERVICAL BIOPSY  W/ LOOP ELECTRODE EXCISION     NASAL SEPTUM SURGERY     NOSE SURGERY     Family History  Problem Relation Age of Onset   Hypertension Mother    Hyperlipidemia Mother    Asthma Mother    Arthritis Mother    Diabetes Father    Hypertension Father    Hyperlipidemia Father    Stroke Maternal Grandmother    Outpatient Medications Prior to Visit  Medication Sig Dispense Refill   acetaminophen (  TYLENOL) 500 MG tablet Take 1,000 mg by mouth every 6 (six) hours as needed for mild pain, moderate pain, fever or headache.     acyclovir (ZOVIRAX) 200 MG capsule TAKE 1 CAPSULE BY MOUTH TWICE A DAY 180 capsule 1   albuterol (VENTOLIN HFA) 108 (90 Base) MCG/ACT inhaler SMARTSIG:2 Puff(s) By Mouth 4 Times Daily     atorvastatin (LIPITOR) 40 MG tablet Take 1 tablet (40 mg total) by mouth daily. 90 tablet 3   bimatoprost (LATISSE) 0.03 % ophthalmic solution  Place 1 drop into both eyes 2 times daily. Place one drop on applicator and apply evenly along the skin of the upper eyelid at base of eyelashes once daily at bedtime; repeat procedure for second eye (use a clean applicator).     cholecalciferol (VITAMIN D) 1000 units tablet Take 2,000 Units by mouth daily.     cloNIDine (CATAPRES) 0.2 MG tablet Take 0.2 mg by mouth 3 (three) times daily.     lidocaine (LIDODERM) 5 % APPLY ONCE A DAY TO AFFECTED AREAS AS NEEDED. 12 HOURS ON AND 12 HOURS OFF 30 patch 2   LORazepam (ATIVAN) 1 MG tablet Take 1 tablet (1 mg total) by mouth 3 (three) times daily. 90 tablet 1   metFORMIN (GLUCOPHAGE) 500 MG tablet Take 2 tablets (1,000 mg total) by mouth 2 (two) times daily. 180 tablet 11   Multiple Vitamins-Minerals (HAIR/SKIN/NAILS) TABS Take 3-4 tablets by mouth daily.     QUEtiapine (SEROQUEL XR) 400 MG 24 hr tablet TAKE 1 TABLET BY MOUTH AT BEDTIME 90 tablet 3   QUEtiapine (SEROQUEL) 100 MG tablet Take 1 tablet (100 mg total) by mouth in the morning. 90 tablet 3   [START ON 05/17/2022] Semaglutide 7 MG TABS Take 7 mg by mouth daily at 12 noon. 90 tablet 3   telmisartan (MICARDIS) 20 MG tablet TAKE 1 TABLET BY MOUTH EVERY DAY 90 tablet 3   zolpidem (AMBIEN) 5 MG tablet Take 1 tablet (5 mg total) by mouth at bedtime as needed for sleep. 15 tablet 1   esomeprazole (NEXIUM) 40 MG capsule Take 1 capsule (40 mg total) by mouth 2 (two) times daily before a meal. (Patient not taking: Reported on 10/10/2021) 60 capsule 3   ondansetron (ZOFRAN) 4 MG tablet TAKE 1 TABLET BY MOUTH EVERY 8 HOURS AS NEEDED FOR NAUSEA AND VOMITING (Patient not taking: Reported on 10/10/2021) 20 tablet 0   oxybutynin (DITROPAN-XL) 5 MG 24 hr tablet Take 5 mg by mouth daily. (Patient not taking: Reported on 10/10/2021)     Plecanatide 3 MG TABS Take by mouth.     promethazine (PHENERGAN) 25 MG tablet      QUEtiapine (SEROQUEL) 400 MG tablet TAKE 1 TABLET BY MOUTH AT BEDTIME 90 tablet 3   No  facility-administered medications prior to visit.   Allergies  Allergen Reactions   Shellfish Allergy Shortness Of Breath   Naloxone Diarrhea and Nausea And Vomiting     Objective:   Today's Vitals   10/10/21 1445  BP: 116/72  Pulse: 85  Temp: 97.6 F (36.4 C)  TempSrc: Temporal  SpO2: 98%  Weight: 246 lb 3.2 oz (111.7 kg)  Height: 5' 6.5" (1.689 m)   Body mass index is 39.14 kg/m.   General: Well developed, well nourished. No acute distress. Lungs: Clear to auscultation bilaterally. No wheezing, rales or rhonchi. Feet: Skin intact. DP and PT pulses 2+. 5.07 monofilament insensate at both heels and the ball of the left foot. Psych:  Alert and oriented. Normal mood and affect.  Health Maintenance Due  Topic Date Due   Pneumococcal Vaccine 57-37 Years old (1 - PCV) Never done   OPHTHALMOLOGY EXAM  Never done   TETANUS/TDAP  Never done   PAP SMEAR-Modifier  Never done   COLONOSCOPY (Pts 45-70yrs Insurance coverage will need to be confirmed)  Never done   COVID-19 Vaccine (4 - Booster for Pfizer series) 02/28/2021   INFLUENZA VACCINE  06/04/2021   Lab Results Last metabolic panel Lab Results  Component Value Date   GLUCOSE 112 (H) 07/19/2021   NA 137 07/19/2021   K 4.6 07/19/2021   CL 99 07/19/2021   CO2 25 07/19/2021   BUN 13 07/19/2021   CREATININE 0.99 07/19/2021   GFRNONAA 54 (L) 10/08/2017   CALCIUM 9.5 07/19/2021   PROT 8.0 10/08/2017   ALBUMIN 4.7 10/08/2017   BILITOT 0.3 10/08/2017   ALKPHOS 58 10/08/2017   AST 30 10/08/2017   ALT 22 10/08/2017   ANIONGAP 11 10/08/2017   Last hemoglobin A1c Lab Results  Component Value Date   HGBA1C 7.7 (H) 07/17/2021     Assessment & Plan:   1. Type 2 diabetes mellitus with diabetic neuropathy, without long-term current use of insulin (HCC) Brandi Welch has noted improved blood sugars at home. I will check her A1c today. Plan to continue metformin and Rybelsus.  - Hemoglobin A1c - Accu-Chek Softclix Lancets  lancets; Use as instructed  Dispense: 100 each; Refill: 12  2. Diabetic polyneuropathy associated with type 2 diabetes mellitus (HCC) Reviewed diabetes foot care, including daily visual check of feet, regular use of moisturizing creams, and avoidance of going bare foot.  3. Essential hypertension Blood pressure is at goal. Continue telmisartan.  4. Gastroesophageal reflux disease without esophagitis I urged Brandi Welch to discuss her concerns about the bowel prep with the gastroenterologist. I think it very important that she follow through with a colonoscopy and EGD.  - Ambulatory referral to Gastroenterology  5. Recurrent vomiting As above. I will repeat labs to rule out any other potential causes.  - Comprehensive metabolic panel - Lipase - CBC - Ambulatory referral to Gastroenterology  6. Viral URI with cough Discussed home care for viral illness, including rest, pushing fluids, and OTC medications as needed for symptom relief. Follow-up if needed for worsening or persistent symptoms.  Loyola Mast, MD

## 2021-10-11 ENCOUNTER — Telehealth: Payer: Self-pay | Admitting: Family Medicine

## 2021-10-11 LAB — CBC
HCT: 35.8 % — ABNORMAL LOW (ref 36.0–46.0)
Hemoglobin: 11.4 g/dL — ABNORMAL LOW (ref 12.0–15.0)
MCHC: 31.8 g/dL (ref 30.0–36.0)
MCV: 88.2 fl (ref 78.0–100.0)
Platelets: 374 10*3/uL (ref 150.0–400.0)
RBC: 4.06 Mil/uL (ref 3.87–5.11)
RDW: 15 % (ref 11.5–15.5)
WBC: 6.3 10*3/uL (ref 4.0–10.5)

## 2021-10-11 LAB — COMPREHENSIVE METABOLIC PANEL
ALT: 26 U/L (ref 0–35)
AST: 25 U/L (ref 0–37)
Albumin: 4.3 g/dL (ref 3.5–5.2)
Alkaline Phosphatase: 94 U/L (ref 39–117)
BUN: 11 mg/dL (ref 6–23)
CO2: 27 mEq/L (ref 19–32)
Calcium: 9.6 mg/dL (ref 8.4–10.5)
Chloride: 102 mEq/L (ref 96–112)
Creatinine, Ser: 0.97 mg/dL (ref 0.40–1.20)
GFR: 70.22 mL/min (ref >60.00)
Glucose, Bld: 117 mg/dL — ABNORMAL HIGH (ref 70–99)
Potassium: 4.2 mEq/L (ref 3.5–5.1)
Sodium: 137 mEq/L (ref 135–145)
Total Bilirubin: 0.3 mg/dL (ref 0.2–1.2)
Total Protein: 7.3 g/dL (ref 6.0–8.3)

## 2021-10-11 LAB — LIPASE: Lipase: 6 U/L — ABNORMAL LOW (ref 11.0–59.0)

## 2021-10-11 LAB — HEMOGLOBIN A1C: Hgb A1c MFr Bld: 6.6 % — ABNORMAL HIGH (ref 4.6–6.5)

## 2021-10-11 NOTE — Telephone Encounter (Signed)
Spoke to patient and she will check at the pharmacy. Called the pharmacy and they received the lancet order from Dr Veto Kemps yesterday and can buy a device for them OTC.  Patient notified VIA phone.

## 2021-10-11 NOTE — Telephone Encounter (Signed)
Pt called stating the Diabetic Meter she needs is the [employer] Glucose Meter from [facility].   She has the meter and the test strips but needs the lancets and the device.

## 2021-10-15 ENCOUNTER — Telehealth: Payer: Self-pay | Admitting: Family Medicine

## 2021-10-15 ENCOUNTER — Ambulatory Visit: Payer: BC Managed Care – PPO | Admitting: Neurology

## 2021-10-15 NOTE — Telephone Encounter (Signed)
Pt is stating she is suppose to be getting a refill on her esomeprazole (NEXIUM) 40 MG capsule [[id number]] and she should be getting a script for her nausea. She is also needing a device to go along with her lancets.  [id number] [place] [place] - [address] AT [facility] [address]  [address] [place] [address]  Phone:  [phone]  Fax:  [phone]  DEA #:  [id number]

## 2021-10-17 NOTE — Progress Notes (Deleted)
NEUROLOGY CONSULTATION NOTE  [name] MRN: [id number] DOB: [birth year]  Referring provider: [name], MD Primary care provider: [name], MD  Reason for consult:  headaches.  Assessment/Plan:   ***   Subjective:  Brandi Welch is a 45 year old female with fibromyalgia, hypothyroidism, Bipolar disorder, anxiety and history of substance abuse who presents for headaches.  History supplemented by PCP, pain management and prior neurology notes.  Reports history of multiple headache syndromes. I  Migraine: ***  2  Cluster Headache: ***  3.  Tension-type headache: ***  4.  Trigeminal neuralgia: ***  08/19/2017 MRI BRAIN WO:  1.  Normal unenhanced MRI of the brain. 2.  The right superior cerebellar artery comes in close proximity to the cisternal segment of the right trigeminal nerve, which could be a source of the patient's reported right-sided trigeminal neuralgia.  Current NSAIDS/analgesics:  acetaminophen Current triptans:  none Current ergotamine:  none Current anti-emetic:  none Current muscle relaxants:  none Current Antihypertensive medications:  clonidine, telmisartan Current Antidepressant medications:  none Current Anticonvulsant medications:  none Current anti-CGRP:  none Current Vitamins/Herbal/Supplements:  *** Current Antihistamines/Decongestants:  none Other therapy:  *** Hormone/birth control:  none Other medications:  quetiapine, lorazepam, zolpidem  Past NSAIDS/analgesics:  hydrocodone, oxycodone, tramadol Past abortive triptans:  *** Past abortive ergotamine:  *** Past muscle relaxants:  *** Past anti-emetic:  Zofran 4mg , promethazine Past antihypertensive medications:  lisinopril Past antidepressant medications:  nortriptyline Past anticonvulsant medications:  gabapentin, pregablin Past anti-CGRP:  *** Past vitamins/Herbal/Supplements:  Mg Past antihistamines/decongestants:  hydroxyzine Other past therapies:   ***  Caffeine:  *** Alcohol:  *** Smoker:  *** Diet:  *** Exercise:  *** Depression:  ***; Anxiety:  *** Other pain:  *** Sleep hygiene:  *** Family history of headache:  ***      PAST MEDICAL HISTORY: Past Medical History:  Diagnosis Date   Anxiety    Arthritis    Baclofen overdose 01/03/2018   Benzodiazepine overdose 01/11/2016   Cocaine intoxication (HCC) 05/04/2018   Fibromyalgia    Hypothyroidism 03/12/2012   IC (interstitial cystitis)    Knee pain, chronic    Opioid overdose (HCC) 01/11/2016   Poisoning by tricyclic antidepressants 01/11/2016   Ulcer     PAST SURGICAL HISTORY: Past Surgical History:  Procedure Laterality Date   BRAIN SURGERY     CERVICAL BIOPSY  W/ LOOP ELECTRODE EXCISION     NASAL SEPTUM SURGERY     NOSE SURGERY      MEDICATIONS: Current Outpatient Medications on File Prior to Visit  Medication Sig Dispense Refill   Accu-Chek Softclix Lancets lancets Use as instructed 100 each 12   acetaminophen (TYLENOL) 500 MG tablet Take 1,000 mg by mouth every 6 (six) hours as needed for mild pain, moderate pain, fever or headache.     acyclovir (ZOVIRAX) 200 MG capsule TAKE 1 CAPSULE BY MOUTH TWICE A DAY 180 capsule 1   albuterol (VENTOLIN HFA) 108 (90 Base) MCG/ACT inhaler SMARTSIG:2 Puff(s) By Mouth 4 Times Daily     atorvastatin (LIPITOR) 40 MG tablet Take 1 tablet (40 mg total) by mouth daily. 90 tablet 3   bimatoprost (LATISSE) 0.03 % ophthalmic solution Place 1 drop into both eyes 2 times daily. Place one drop on applicator and apply evenly along the skin of the upper eyelid at base of eyelashes once daily at bedtime; repeat procedure for second eye (use a clean applicator).     cholecalciferol (VITAMIN D) 1000 units  tablet Take 2,000 Units by mouth daily.     cloNIDine (CATAPRES) 0.2 MG tablet Take 0.2 mg by mouth 3 (three) times daily.     esomeprazole (NEXIUM) 40 MG capsule Take 1 capsule (40 mg total) by mouth 2 (two) times daily before a meal. (Patient  not taking: Reported on 10/10/2021) 60 capsule 3   lidocaine (LIDODERM) 5 % APPLY ONCE A DAY TO AFFECTED AREAS AS NEEDED. 12 HOURS ON AND 12 HOURS OFF 30 patch 2   LORazepam (ATIVAN) 1 MG tablet Take 1 tablet (1 mg total) by mouth 3 (three) times daily. 90 tablet 1   metFORMIN (GLUCOPHAGE) 500 MG tablet Take 2 tablets (1,000 mg total) by mouth 2 (two) times daily. 180 tablet 11   Multiple Vitamins-Minerals (HAIR/SKIN/NAILS) TABS Take 3-4 tablets by mouth daily.     ondansetron (ZOFRAN) 4 MG tablet TAKE 1 TABLET BY MOUTH EVERY 8 HOURS AS NEEDED FOR NAUSEA AND VOMITING (Patient not taking: Reported on 10/10/2021) 20 tablet 0   QUEtiapine (SEROQUEL XR) 400 MG 24 hr tablet TAKE 1 TABLET BY MOUTH AT BEDTIME 90 tablet 3   QUEtiapine (SEROQUEL) 100 MG tablet Take 1 tablet (100 mg total) by mouth in the morning. 90 tablet 3   [START ON 05/17/2022] Semaglutide 7 MG TABS Take 7 mg by mouth daily at 12 noon. 90 tablet 3   telmisartan (MICARDIS) 20 MG tablet TAKE 1 TABLET BY MOUTH EVERY DAY 90 tablet 3   zolpidem (AMBIEN) 5 MG tablet Take 1 tablet (5 mg total) by mouth at bedtime as needed for sleep. 15 tablet 1   No current facility-administered medications on file prior to visit.    ALLERGIES: Allergies  Allergen Reactions   Shellfish Allergy Shortness Of Breath   Naloxone Diarrhea and Nausea And Vomiting    FAMILY HISTORY: Family History  Problem Relation Age of Onset   Hypertension Mother    Hyperlipidemia Mother    Asthma Mother    Arthritis Mother    Diabetes Father    Hypertension Father    Hyperlipidemia Father    Stroke Maternal Grandmother     Objective:  *** General: No acute distress.  Patient appears well-groomed.   Head:  Normocephalic/atraumatic Eyes:  fundi examined but not visualized Neck: supple, no paraspinal tenderness, full range of motion Back: No paraspinal tenderness Heart: regular rate and rhythm Lungs: Clear to auscultation bilaterally. Vascular: No carotid  bruits. Neurological Exam: Mental status: alert and oriented to person, place, and time, recent and remote memory intact, fund of knowledge intact, attention and concentration intact, speech fluent and not dysarthric, language intact. Cranial nerves: CN I: not tested CN II: pupils equal, round and reactive to light, visual fields intact CN III, IV, VI:  full range of motion, no nystagmus, no ptosis CN V: facial sensation intact. CN VII: upper and lower face symmetric CN VIII: hearing intact CN IX, X: gag intact, uvula midline CN XI: sternocleidomastoid and trapezius muscles intact CN XII: tongue midline Bulk & Tone: normal, no fasciculations. Motor:  muscle strength 5/5 throughout Sensation:  Pinprick, temperature and vibratory sensation intact. Deep Tendon Reflexes:  2+ throughout,  toes downgoing.   Finger to nose testing:  Without dysmetria.   Heel to shin:  Without dysmetria.   Gait:  Normal station and stride.  Romberg negative.    Thank you for allowing me to take part in the care of this patient.  Shon Millet, DO  CC: [name], MD

## 2021-10-18 ENCOUNTER — Ambulatory Visit: Payer: BC Managed Care – PPO | Admitting: Neurology

## 2021-10-19 ENCOUNTER — Other Ambulatory Visit: Payer: Self-pay | Admitting: Family Medicine

## 2021-10-19 DIAGNOSIS — K219 Gastro-esophageal reflux disease without esophagitis: Secondary | ICD-10-CM

## 2021-10-19 DIAGNOSIS — R110 Nausea: Secondary | ICD-10-CM

## 2021-10-22 MED ORDER — BLOOD GLUCOSE MONITOR SYSTEM W/DEVICE KIT: 1.0000 [IU] | PACK | Freq: Every day | 0 refills | Status: AC

## 2021-10-22 MED ORDER — ESOMEPRAZOLE MAGNESIUM 40 MG PO CPDR: 40.0000 mg | DELAYED_RELEASE_CAPSULE | Freq: Two times a day (BID) | ORAL | 3 refills | Status: DC

## 2021-10-22 NOTE — Addendum Note (Signed)
Addended by: Waymond Cera on: 10/22/2021 04:27 PM   Modules accepted: Orders

## 2021-10-22 NOTE — Telephone Encounter (Signed)
Lft VM to rtn call. Dm/cma  

## 2021-10-22 NOTE — Telephone Encounter (Signed)
Patient states that she needs a whole new meter now.  Placed order for device/supplies and then she states she needs refills on Nexium and Zofran.    Please review and advise.  Thanks. Dm/cma

## 2021-10-22 NOTE — Telephone Encounter (Signed)
Pt returned call from Sherre Lain will call pt back. --KR

## 2021-10-23 ENCOUNTER — Other Ambulatory Visit: Payer: Self-pay

## 2021-10-23 DIAGNOSIS — R110 Nausea: Secondary | ICD-10-CM

## 2021-10-23 MED ORDER — ONDANSETRON HCL 4 MG PO TABS: ORAL_TABLET | ORAL | 0 refills | Status: DC

## 2021-10-23 NOTE — Telephone Encounter (Signed)
Lft detailed VM that rx was sent to the pharmacy. Dm/cma ° °

## 2021-10-30 ENCOUNTER — Telehealth: Payer: Self-pay | Admitting: Family Medicine

## 2021-10-30 NOTE — Telephone Encounter (Signed)
Spoke to patient and pharmacy. Last refill date 10/03/21 was filled on 10/05/21 and is not available to be filled till 11/01/21. Patient notified VIA phone.   No further questions. Dm/cma

## 2021-10-30 NOTE — Telephone Encounter (Signed)
What is the name of the medication? LORazepam (ATIVAN) 1 MG tablet [[id number]]  Have you contacted your pharmacy to request a refill? Pt is needing a refill.   Which pharmacy would you like this sent to? [id number] [place], [place] - [address] AT [facility] [address]  [address] [place] [address]  Phone:  [phone]  Fax:  [phone]  DEA #:  [id number]   Patient notified that their request is being sent to the clinical staff for review and that they should receive a call once it is complete. If they do not receive a call within 72 hours they can check with their pharmacy or our office.

## 2021-11-01 ENCOUNTER — Other Ambulatory Visit: Payer: Self-pay | Admitting: Family Medicine

## 2021-11-01 DIAGNOSIS — E782 Mixed hyperlipidemia: Secondary | ICD-10-CM

## 2021-11-07 ENCOUNTER — Other Ambulatory Visit: Payer: Self-pay

## 2021-11-07 MED ORDER — GLUCOSE BLOOD VI STRP: 1.0000 | ORAL_STRIP | 1 refills | Status: AC | PRN

## 2021-11-09 DIAGNOSIS — Z79899 Other long term (current) drug therapy: Secondary | ICD-10-CM | POA: Diagnosis not present

## 2021-11-09 DIAGNOSIS — Z5181 Encounter for therapeutic drug level monitoring: Secondary | ICD-10-CM | POA: Diagnosis not present

## 2021-11-11 ENCOUNTER — Other Ambulatory Visit: Payer: Self-pay | Admitting: Family Medicine

## 2021-11-11 DIAGNOSIS — E782 Mixed hyperlipidemia: Secondary | ICD-10-CM

## 2021-12-03 ENCOUNTER — Other Ambulatory Visit: Payer: Self-pay | Admitting: Family Medicine

## 2021-12-03 DIAGNOSIS — F411 Generalized anxiety disorder: Secondary | ICD-10-CM

## 2021-12-04 MED ORDER — LORAZEPAM 1 MG PO TABS: 1.0000 mg | ORAL_TABLET | Freq: Three times a day (TID) | ORAL | 1 refills | Status: DC

## 2021-12-04 NOTE — Telephone Encounter (Signed)
Refill request for: Lorazepam 1 mg LR 10/03/21, #90, 1 rf LOV 10/10/21 FOV 01/08/22  Please review and advise.  Thanks.  Dm/cma

## 2021-12-04 NOTE — Telephone Encounter (Signed)
Lft detailed VM that RX was sent to the pharmacy. Dm/cma  

## 2021-12-27 DIAGNOSIS — R21 Rash and other nonspecific skin eruption: Secondary | ICD-10-CM | POA: Diagnosis not present

## 2021-12-27 DIAGNOSIS — N3020 Other chronic cystitis without hematuria: Secondary | ICD-10-CM | POA: Diagnosis not present

## 2021-12-27 DIAGNOSIS — M542 Cervicalgia: Secondary | ICD-10-CM | POA: Diagnosis not present

## 2021-12-27 DIAGNOSIS — F411 Generalized anxiety disorder: Secondary | ICD-10-CM | POA: Diagnosis not present

## 2021-12-27 DIAGNOSIS — E785 Hyperlipidemia, unspecified: Secondary | ICD-10-CM | POA: Diagnosis not present

## 2021-12-27 DIAGNOSIS — G500 Trigeminal neuralgia: Secondary | ICD-10-CM | POA: Diagnosis not present

## 2021-12-27 DIAGNOSIS — M12561 Traumatic arthropathy, right knee: Secondary | ICD-10-CM | POA: Diagnosis not present

## 2021-12-27 DIAGNOSIS — I10 Essential (primary) hypertension: Secondary | ICD-10-CM | POA: Diagnosis not present

## 2022-01-02 ENCOUNTER — Other Ambulatory Visit: Payer: Self-pay | Admitting: Family Medicine

## 2022-01-05 ENCOUNTER — Other Ambulatory Visit: Payer: Self-pay | Admitting: Family Medicine

## 2022-01-08 ENCOUNTER — Ambulatory Visit: Payer: BC Managed Care – PPO | Admitting: Family Medicine

## 2022-01-16 ENCOUNTER — Other Ambulatory Visit: Payer: Self-pay | Admitting: Family Medicine

## 2022-01-24 ENCOUNTER — Other Ambulatory Visit: Payer: Self-pay | Admitting: Family

## 2022-01-24 ENCOUNTER — Ambulatory Visit: Payer: Medicaid Other | Admitting: Family Medicine

## 2022-01-24 ENCOUNTER — Encounter: Payer: Self-pay | Admitting: Neurology

## 2022-01-24 ENCOUNTER — Telehealth: Payer: Self-pay

## 2022-01-24 ENCOUNTER — Telehealth: Payer: Self-pay | Admitting: Family Medicine

## 2022-01-24 VITALS — BP 120/76 | Temp 98.2°F | Ht 66.5 in | Wt 251.0 lb

## 2022-01-24 DIAGNOSIS — F319 Bipolar disorder, unspecified: Secondary | ICD-10-CM | POA: Diagnosis not present

## 2022-01-24 DIAGNOSIS — N3010 Interstitial cystitis (chronic) without hematuria: Secondary | ICD-10-CM

## 2022-01-24 DIAGNOSIS — I10 Essential (primary) hypertension: Secondary | ICD-10-CM

## 2022-01-24 DIAGNOSIS — F5101 Primary insomnia: Secondary | ICD-10-CM | POA: Diagnosis not present

## 2022-01-24 DIAGNOSIS — R110 Nausea: Secondary | ICD-10-CM

## 2022-01-24 DIAGNOSIS — G44099 Other trigeminal autonomic cephalgias (TAC), not intractable: Secondary | ICD-10-CM | POA: Diagnosis not present

## 2022-01-24 DIAGNOSIS — Z23 Encounter for immunization: Secondary | ICD-10-CM

## 2022-01-24 DIAGNOSIS — B009 Herpesviral infection, unspecified: Secondary | ICD-10-CM

## 2022-01-24 DIAGNOSIS — E1140 Type 2 diabetes mellitus with diabetic neuropathy, unspecified: Secondary | ICD-10-CM

## 2022-01-24 DIAGNOSIS — E782 Mixed hyperlipidemia: Secondary | ICD-10-CM

## 2022-01-24 LAB — URINALYSIS, ROUTINE W REFLEX MICROSCOPIC
Bilirubin Urine: NEGATIVE
Hgb urine dipstick: NEGATIVE
Ketones, ur: NEGATIVE
Leukocytes,Ua: NEGATIVE
Nitrite: NEGATIVE
RBC / HPF: NONE SEEN (ref 0–?)
Specific Gravity, Urine: 1.015 (ref 1.000–1.030)
Total Protein, Urine: NEGATIVE
Urine Glucose: NEGATIVE
Urobilinogen, UA: 0.2 (ref 0.0–1.0)
pH: 6 (ref 5.0–8.0)

## 2022-01-24 LAB — LIPID PANEL
Cholesterol: 385 mg/dL — ABNORMAL HIGH (ref 0–200)
HDL: 36.3 mg/dL — ABNORMAL LOW (ref >39.00)
Total CHOL/HDL Ratio: 11
Triglycerides: 1235 mg/dL — ABNORMAL HIGH (ref 0.0–149.0)

## 2022-01-24 LAB — LDL CHOLESTEROL, DIRECT: Direct LDL: 81 mg/dL

## 2022-01-24 LAB — BASIC METABOLIC PANEL
BUN: 9 mg/dL (ref 6–23)
CO2: 30 mEq/L (ref 19–32)
Calcium: 8.9 mg/dL (ref 8.4–10.5)
Chloride: 101 mEq/L (ref 96–112)
Creatinine, Ser: 0.96 mg/dL (ref 0.40–1.20)
GFR: 70.95 mL/min (ref >60.00)
Glucose, Bld: 174 mg/dL — ABNORMAL HIGH (ref 70–99)
Potassium: 3.8 mEq/L (ref 3.5–5.1)
Sodium: 137 mEq/L (ref 135–145)

## 2022-01-24 LAB — HEMOGLOBIN A1C: Hgb A1c MFr Bld: 8.6 % — ABNORMAL HIGH (ref 4.6–6.5)

## 2022-01-24 LAB — MICROALBUMIN / CREATININE URINE RATIO
Creatinine,U: 105.4 mg/dL
Microalb Creat Ratio: 0.7 mg/g (ref 0.0–30.0)
Microalb, Ur: <0.7 mg/dL (ref 0.0–1.9)

## 2022-01-24 MED ORDER — CLONIDINE HCL 0.2 MG PO TABS: 0.2000 mg | ORAL_TABLET | Freq: Three times a day (TID) | ORAL | 3 refills | Status: DC

## 2022-01-24 MED ORDER — QUETIAPINE FUMARATE ER 400 MG PO TB24: 400.0000 mg | ORAL_TABLET | Freq: Every day | ORAL | 3 refills | Status: DC

## 2022-01-24 MED ORDER — QUETIAPINE FUMARATE 100 MG PO TABS: 100.0000 mg | ORAL_TABLET | Freq: Every morning | ORAL | 3 refills | Status: DC

## 2022-01-24 MED ORDER — TELMISARTAN 20 MG PO TABS: 20.0000 mg | ORAL_TABLET | Freq: Every day | ORAL | 3 refills | Status: DC

## 2022-01-24 MED ORDER — ZOLPIDEM TARTRATE 5 MG PO TABS: 5.0000 mg | ORAL_TABLET | Freq: Every evening | ORAL | 1 refills | Status: DC | PRN

## 2022-01-24 MED ORDER — ONDANSETRON HCL 4 MG PO TABS: ORAL_TABLET | ORAL | 0 refills | Status: DC

## 2022-01-24 NOTE — Telephone Encounter (Signed)
PA for Telmirsartan  submitted by fax to insurance.  Awaiting response.  Dm/cma ? ?
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
## 2022-01-25 NOTE — Telephone Encounter (Signed)
Lft VM to rtn call. Dm/cma  

## 2022-01-28 NOTE — Telephone Encounter (Signed)
Called pharmacy and her insurance won't do an early fill  (Medicaid).  She will have to pay cash prices.  Lft VM to rtn call Dm/cma ?

## 2022-01-28 NOTE — Telephone Encounter (Signed)
Lft VM to rtn call. Dm/cma  

## 2022-01-29 NOTE — Telephone Encounter (Signed)
Lft VM to rtn call. Dm/cma  

## 2022-01-29 NOTE — Telephone Encounter (Signed)
Spoke to patient, she was able to get them refilled.Dm/cma ? ?

## 2022-01-30 ENCOUNTER — Ambulatory Visit: Payer: Medicaid Other | Admitting: Family Medicine

## 2022-01-30 VITALS — BP 126/74 | HR 110 | Temp 97.9°F | Ht 66.5 in | Wt 251.8 lb

## 2022-01-30 DIAGNOSIS — R051 Acute cough: Secondary | ICD-10-CM

## 2022-01-30 DIAGNOSIS — F411 Generalized anxiety disorder: Secondary | ICD-10-CM | POA: Diagnosis not present

## 2022-01-30 DIAGNOSIS — E1165 Type 2 diabetes mellitus with hyperglycemia: Secondary | ICD-10-CM | POA: Diagnosis not present

## 2022-01-30 MED ORDER — LORAZEPAM 1 MG PO TABS: 1.0000 mg | ORAL_TABLET | Freq: Three times a day (TID) | ORAL | 1 refills | Status: DC

## 2022-01-30 NOTE — Progress Notes (Signed)
?[facility] ?[facility] ?[place] ?[place] [address] ?Dept: [phone] ?Dept Fax: [phone] ? ?Office Visit ? ?Subjective:  ? ? Patient ID: Brandi Welch, female    DOB: Apr 02, 1976, 46 y.o..   MRN: [id number] ? ?Chief Complaint  ?Patient presents with  ? Follow-up  ?  F/u to discuss lab results.    ? ? ?History of Present Illness: ? ?Patient is in today to discuss her diabetes control. Brandi Welch was recently in the office for routine follow-up of her diabetes. Her A1c returned significantly elevated compared to her value in Dec. She notes that she had been off of her diabetes medications for about 3 weeks prior to that visit. Early in March, her pharmacist had raised concerns about her receiving both lisinopril and telmisartan, noting it is not recommended to take both. She states she became scared about this, so stopped all of her meds. Since her visit last week, she now has resumed her metformin and Rybelsus. ? ?Brandi Welch notes a recent cough. She wonders if this might be from her allergies. ? ?Brandi Welch notes she is due for a refill of her lorazepam. She is on Seroquel and lorazepam for management of her bipolar disorder and anxiety. ? ?Past Medical History: ?Patient Active Problem List  ? Diagnosis Date Noted  ? Diabetic neuropathy ([place]) 10/10/2021  ? Gastroesophageal reflux disease without esophagitis 07/17/2021  ? Class 3 severe obesity due to excess calories with serious comorbidity and body mass index (BMI) of 40.0 to 44.9 in adult [facility]) 02/07/2021  ? Bipolar disorder ([place]) 01/10/2021  ? Zoster 01/10/2021  ? Type 2 diabetes mellitus with diabetic neuropathy, unspecified ([place]) 01/10/2021  ? Vitamin D deficiency 01/10/2021  ? Chronic constipation 01/10/2021  ? Hematochezia 01/10/2021  ? History of substance abuse ([place]) 05/04/2018  ? Hyperlipidemia 05/04/2018  ? AKI (acute kidney injury) ([place]) 05/04/2018  ? Trigeminal autonomic cephalgias  11/06/2017  ? History of repeated overdose 10/08/2017  ? Chronic migraine 08/06/2017  ? History of opioid abuse ([place]) 01/13/2016  ? QT prolongation 01/11/2016  ? Osteoarthritis 04/12/2015  ? Allergic rhinitis 04/12/2015  ? 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 Interstitial cystitis (chronic) without hematuria 06/21/2014  ? Generalized anxiety disorder 11/30/2012  ? Low back pain 01/22/2012  ? Myofascial pain 01/22/2012  ? Essential hypertension 12/18/2011  ? Fibromyalgia 06/27/2011  ? ?Past Surgical History:  ?Procedure Laterality Date  ? BRAIN SURGERY    ? CERVICAL BIOPSY  W/ LOOP ELECTRODE EXCISION    ? NASAL SEPTUM SURGERY    ? NOSE SURGERY    ? ?Family History  ?Problem Relation Age of Onset  ? Hypertension Mother   ? Hyperlipidemia Mother   ? Asthma Mother   ? Arthritis Mother   ? Diabetes Father   ? Hypertension Father   ? Hyperlipidemia Father   ? Stroke Maternal Grandmother   ? ?Outpatient Medications Prior to Visit  ?Medication Sig Dispense Refill  ? Accu-Chek Softclix Lancets lancets Use as instructed 100 each 12  ? acetaminophen (TYLENOL) 500 MG tablet Take 1,000 mg by mouth every 6 (six) hours as needed for mild pain, moderate pain, fever or headache.    ? acyclovir (ZOVIRAX) 200 MG capsule TAKE 1 CAPSULE BY MOUTH TWICE A DAY 180 capsule 3  ? albuterol (VENTOLIN HFA) 108 (90 Base) MCG/ACT inhaler     ? atorvastatin (LIPITOR) 40 MG tablet Take 1 tablet (40 mg total) by mouth daily. 90 tablet 3  ?  bimatoprost (LATISSE) 0.03 % ophthalmic solution Place 1 drop into both eyes 2 times daily. Place one drop on applicator and apply evenly along the skin of the upper eyelid at base of eyelashes once daily at bedtime; repeat procedure for second eye (use a clean applicator).    ? Blood Glucose Monitoring Suppl (BLOOD GLUCOSE MONITOR SYSTEM) w/Device KIT 1 Units by Does not apply route daily. DX: Diabetes;  please fill with whichever one insurance covers. 1 kit 0  ? cholecalciferol (VITAMIN D) 1000 units  tablet Take 2,000 Units by mouth daily.    ? cloNIDine (CATAPRES) 0.2 MG tablet Take 1 tablet (0.2 mg total) by mouth 3 (three) times daily. 180 tablet 3  ? esomeprazole (NEXIUM) 40 MG capsule Take 1 capsule (40 mg total) by mouth 2 (two) times daily before a meal. 60 capsule 3  ? glucose blood test strip 1 each by Other route as needed for other. Accu- check Guide test strips.   Use as instructed 100 each 1  ? lidocaine (LIDODERM) 5 % APPLY ONCE A DAY TO AFFECTED AREAS AS NEEDED. 12 HOURS ON AND 12 HOURS OFF 30 patch 2  ? LORazepam (ATIVAN) 1 MG tablet Take 1 tablet (1 mg total) by mouth 3 (three) times daily. 90 tablet 1  ? metFORMIN (GLUCOPHAGE) 500 MG tablet Take 2 tablets (1,000 mg total) by mouth 2 (two) times daily. 180 tablet 11  ? Multiple Vitamins-Minerals (HAIR/SKIN/NAILS) TABS Take 3-4 tablets by mouth daily.    ? ondansetron (ZOFRAN) 4 MG tablet TAKE 1 TABLET BY MOUTH EVERY 8 HOURS AS NEEDED FOR NAUSEA AND VOMITING 20 tablet 0  ? QUEtiapine (SEROQUEL XR) 400 MG 24 hr tablet Take 1 tablet (400 mg total) by mouth at bedtime. 90 tablet 3  ? QUEtiapine (SEROQUEL) 100 MG tablet Take 1 tablet (100 mg total) by mouth in the morning. 90 tablet 3  ? [START ON 05/17/2022] Semaglutide 7 MG TABS Take 7 mg by mouth daily at 12 noon. 90 tablet 3  ? telmisartan (MICARDIS) 20 MG tablet Take 1 tablet (20 mg total) by mouth daily. 90 tablet 3  ? zolpidem (AMBIEN) 5 MG tablet Take 1 tablet (5 mg total) by mouth at bedtime as needed for sleep. 15 tablet 1  ? ?No facility-administered medications prior to visit.  ? ?Allergies  ?Allergen Reactions  ? Shellfish Allergy Shortness Of Breath  ? Naloxone Diarrhea and Nausea And Vomiting  ?   ?Objective:  ? ?Today's Vitals  ? 01/30/22 1403  ?BP: 126/74  ?Pulse: (!) 110  ?Temp: 97.9 ?F (36.6 ?C)  ?TempSrc: Temporal  ?SpO2: 95%  ?Weight: 251 lb 12.8 oz (114.2 kg)  ?Height: 5' 6.5" (1.689 m)  ? ?Body mass index is 40.03 kg/m?.  ? ?General: Well developed, well nourished. No acute  distress. ?HEENT: Mucous membranes moist. Oropharynx clear. ?Lungs: Clear to auscultation bilaterally. No wheezing, rales or rhonchi. ?Psych: Alert and oriented. Normal mood and affect. ? ?Health Maintenance Due  ?Topic Date Due  ? PAP SMEAR-Modifier  Never done  ? COLONOSCOPY (Pts 45-33yr Insurance coverage will need to be confirmed)  Never done  ? ?Lab Results ? ?  Latest Ref Rng & Units 01/24/2022  ? 11:47 AM 10/10/2021  ?  3:17 PM 07/19/2021  ?  4:08 PM  ?BMP  ?Glucose 70 - 99 mg/dL 174   117   112    ?BUN 6 - 23 mg/dL _0 ?Creatinine  0.40 - 1.20 mg/dL 0.96   0.97   0.99    ?Sodium 135 - 145 mEq/L 137   137   137    ?Potassium 3.5 - 5.1 mEq/L 3.8   4.2   4.6    ?Chloride 96 - 112 mEq/L 101   102   99    ?CO2 19 - 32 mEq/L _0 ?Calcium 8.4 - 10.5 mg/dL 8.9   9.6   9.5    ? ?Last hemoglobin A1c ?Lab Results  ?Component Value Date  ? HGBA1C 8.6 (H) 01/24/2022  ?  ?   ?Assessment & Plan:  ? ?1. Uncontrolled type 2 diabetes mellitus with hyperglycemia ([place]) ?Ms. Seipp's increase in her R4V could certainly be explained by being off of her meds for several weeks. she has not resumed her Rybelsus 7 mg daily and metformin 500 mg 2 tabs twice a day. I will reassess this in 3 months. ? ?2. Acute cough ?Appears minor. Recommend she monitor this for now. ? ?3. Generalized anxiety disorder ?I will renew her lorazepam. ? ?- LORazepam (ATIVAN) 1 MG tablet; Take 1 tablet (1 mg total) by mouth 3 (three) times daily.  Dispense: 90 tablet; Refill: 1 ? ?Return for As scheduled.  ? ?Haydee Salter, MD ?

## 2022-01-30 NOTE — Telephone Encounter (Signed)
Patient was approved and has gotten it form the pharmacy.  Dm/cma ? ?

## 2022-02-11 ENCOUNTER — Encounter: Payer: Self-pay | Admitting: Family Medicine

## 2022-02-13 ENCOUNTER — Other Ambulatory Visit: Payer: Self-pay | Admitting: Family Medicine

## 2022-02-13 DIAGNOSIS — N3010 Interstitial cystitis (chronic) without hematuria: Secondary | ICD-10-CM | POA: Diagnosis not present

## 2022-02-13 DIAGNOSIS — E782 Mixed hyperlipidemia: Secondary | ICD-10-CM

## 2022-02-13 DIAGNOSIS — R8290 Unspecified abnormal findings in urine: Secondary | ICD-10-CM | POA: Diagnosis not present

## 2022-02-18 ENCOUNTER — Telehealth: Payer: Self-pay

## 2022-02-18 NOTE — Telephone Encounter (Signed)
PA for Telmisartan 20 mg submitted through cover my meds.  Awaiting on response. Dm/cma ? ? ?Key: Janee Morn ?

## 2022-02-18 NOTE — Telephone Encounter (Signed)
PA for the Quetiapine Rumarate 400 mg ? Was approved from 02/15/22 - 02/16/23. Pharmacy notified VIA phone. Dm/cma ? ?

## 2022-02-19 NOTE — Telephone Encounter (Signed)
PA for Telmisartan was denied due to meeting requirements of trial/failure criteria of not proffered drugs.  Last time I spoke to patient she had gotten her prescription from the pharmacy.  Dm/cma ? ?

## 2022-02-27 ENCOUNTER — Telehealth: Payer: Self-pay

## 2022-02-27 NOTE — Telephone Encounter (Signed)
PA for Telmisartan 20 mg denied due she must try two of the following preferred drugs:   Irbesartan, Losartan, Olmesartan, Valsartan.  ?Will address at next appointment.  Dm/cma ? ?

## 2022-02-28 ENCOUNTER — Telehealth: Payer: Self-pay | Admitting: Family Medicine

## 2022-02-28 NOTE — Telephone Encounter (Signed)
Pt has had the pharmacy tell her she needs PA for her Rybellsus. [facility] [address] ?

## 2022-02-28 NOTE — Telephone Encounter (Signed)
PA submitted through cover my meds.  Awaitng response. Dm/cma ? ?Key: [id number] ?

## 2022-03-05 NOTE — Telephone Encounter (Signed)
PA denied due to not meeting criteria:  must have tried two of the following preferred drugs: ? ?Ozempic, Victoza, Byetta Trulicity.  ?  ?Please review and advise.  ?Thanks. Dm/cma ? ?

## 2022-03-06 NOTE — Telephone Encounter (Signed)
Lft VM to rtn call. Dm/cma  

## 2022-03-07 NOTE — Telephone Encounter (Signed)
Spoke to patient and scheduled her for an appointment 03/08/22@2 :20 pm. Dm/cma ? ?

## 2022-03-08 ENCOUNTER — Ambulatory Visit: Payer: Medicaid Other | Admitting: Family Medicine

## 2022-03-08 ENCOUNTER — Telehealth: Payer: Self-pay | Admitting: Family Medicine

## 2022-03-08 NOTE — Telephone Encounter (Signed)
Patient/Caregiver was notified of No Show/Late Cancellation Policy & possible $50 charge. ?Visit was cancelled with reason "No Show/Cancel within 24 hours" for tracking & charging. ? ?Caller Name: Aneyah Lortz ?Caller Ph #: [phone] ?Date of APPT: 03/08/22 ?Reason given for no show/late cancellation: Doesn't have transportation  ?No Show Letter printed & put in outgoing mail (Yes/No): yes ? ?~~~Route message to admin supervisor and clinical team/CMA~~~ ? ? ? ?

## 2022-03-11 ENCOUNTER — Ambulatory Visit: Payer: Medicaid Other | Admitting: Family Medicine

## 2022-03-11 VITALS — BP 128/76 | HR 90 | Temp 98.0°F | Ht 66.5 in | Wt 253.0 lb

## 2022-03-11 DIAGNOSIS — E1140 Type 2 diabetes mellitus with diabetic neuropathy, unspecified: Secondary | ICD-10-CM | POA: Diagnosis not present

## 2022-03-11 DIAGNOSIS — R110 Nausea: Secondary | ICD-10-CM

## 2022-03-11 DIAGNOSIS — I10 Essential (primary) hypertension: Secondary | ICD-10-CM | POA: Diagnosis not present

## 2022-03-11 DIAGNOSIS — F5101 Primary insomnia: Secondary | ICD-10-CM

## 2022-03-11 MED ORDER — GLIPIZIDE 5 MG PO TABS: 2.5000 mg | ORAL_TABLET | Freq: Two times a day (BID) | ORAL | 3 refills | Status: DC

## 2022-03-11 MED ORDER — ZOLPIDEM TARTRATE 5 MG PO TABS: 5.0000 mg | ORAL_TABLET | Freq: Every evening | ORAL | 1 refills | Status: DC | PRN

## 2022-03-11 MED ORDER — ONDANSETRON HCL 4 MG PO TABS: ORAL_TABLET | ORAL | 0 refills | Status: DC

## 2022-03-11 MED ORDER — IRBESARTAN 75 MG PO TABS: 75.0000 mg | ORAL_TABLET | Freq: Every day | ORAL | 3 refills | Status: AC

## 2022-03-11 NOTE — Progress Notes (Signed)
?[facility] ?[facility] ?[place] ?[place] [address] ?Dept: [phone] ?Dept Fax: [phone] ? ?Chronic Care Office Visit ? ?Subjective:  ? ? Patient ID: Brandi Welch, female    DOB: 08-03-1976, 46 y.o..   MRN: [id number] ? ?Chief Complaint  ?Patient presents with  ? Follow-up  ?  F/u meds.    ? ? ?History of Present Illness: ? ?Patient is in today for reassessment of chronic medical issues. ? ?Patient is in today to discuss her diabetes meds. She has been being treated with metformin 500 mg 2 tabs bid and Rybelsus 7 mg daily. However, her insurance is not covering her Rybelsus and she feels she cannot afford this. She notes that her father takes glipizide and she wonders about starting this. ? ?[name] also has a history of hypertension. She also notes her insurance is not covering her telmisartan, so she needs to switch to something on formulary. ?  ?[name] uses periodic zolpidem for management of insomnia and requests a refill.  ? ?[name] has a history of periodic nausea/vomiting. She uses occasional ondansetron for management. ? ?Past Medical History: ?Patient Active Problem List  ? Diagnosis Date Noted  ? Diabetic neuropathy ([place]) 10/10/2021  ? Gastroesophageal reflux disease without esophagitis 07/17/2021  ? Class 3 severe obesity due to excess calories with serious comorbidity and body mass index (BMI) of 40.0 to 44.9 in adult [facility]) 02/07/2021  ? Bipolar disorder ([facility]) 01/10/2021  ? Zoster 01/10/2021  ? Type 2 diabetes mellitus with diabetic neuropathy, unspecified ([place]) 01/10/2021  ? Vitamin D deficiency 01/10/2021  ? Chronic constipation 01/10/2021  ? Hematochezia 01/10/2021  ? History of substance abuse ([place]) 05/04/2018  ? Hyperlipidemia 05/04/2018  ? AKI (acute kidney injury) ([place]) 05/04/2018  ? 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 History of repeated overdose 10/08/2017  ? Chronic migraine 08/06/2017  ? History of  opioid abuse ([place]) 01/13/2016  ? QT prolongation 01/11/2016  ? Osteoarthritis 04/12/2015  ? Allergic rhinitis 04/12/2015  ? 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 Interstitial cystitis (chronic) without hematuria 06/21/2014  ? Generalized anxiety disorder 11/30/2012  ? Low back pain 01/22/2012  ? Myofascial pain 01/22/2012  ? Essential hypertension 12/18/2011  ? Fibromyalgia 06/27/2011  ? ?Past Surgical History:  ?Procedure Laterality Date  ? BRAIN SURGERY    ? CERVICAL BIOPSY  W/ LOOP ELECTRODE EXCISION    ? NASAL SEPTUM SURGERY    ? NOSE SURGERY    ? ?Family History  ?Problem Relation Age of Onset  ? Hypertension Mother   ? Hyperlipidemia Mother   ? Asthma Mother   ? Arthritis Mother   ? Diabetes Father   ? Hypertension Father   ? Hyperlipidemia Father   ? Stroke Maternal Grandmother   ? ?Outpatient Medications Prior to Visit  ?Medication Sig Dispense Refill  ? Accu-Chek Softclix Lancets lancets Use as instructed 100 each 12  ? acetaminophen (TYLENOL) 500 MG tablet Take 1,000 mg by mouth every 6 (six) hours as needed for mild pain, moderate pain, fever or headache.    ? acyclovir (ZOVIRAX) 200 MG capsule TAKE 1 CAPSULE BY MOUTH TWICE A DAY 180 capsule 3  ? albuterol (VENTOLIN HFA) 108 (90 Base) MCG/ACT inhaler     ? atorvastatin (LIPITOR) 40 MG tablet TAKE 1 TABLET BY MOUTH EVERY DAY 90 tablet 3  ? bimatoprost (LATISSE) 0.03 % ophthalmic solution Place 1 drop into both eyes 2 times daily. Place one drop on applicator and apply evenly  along the skin of the upper eyelid at base of eyelashes once daily at bedtime; repeat procedure for second eye (use a clean applicator).    ? Blood Glucose Monitoring Suppl (BLOOD GLUCOSE MONITOR SYSTEM) w/Device KIT 1 Units by Does not apply route daily. DX: Diabetes;  please fill with whichever one insurance covers. 1 kit 0  ? cholecalciferol (VITAMIN D) 1000 units tablet Take 2,000 Units by mouth daily.    ? cloNIDine (CATAPRES) 0.2 MG tablet Take 1 tablet (0.2 mg total) by  mouth 3 (three) times daily. 180 tablet 3  ? esomeprazole (NEXIUM) 40 MG capsule Take 1 capsule (40 mg total) by mouth 2 (two) times daily before a meal. 60 capsule 3  ? gabapentin (NEURONTIN) 300 MG capsule Take 300 mg by mouth 3 (three) times daily.    ? glucose blood test strip 1 each by Other route as needed for other. Accu- check Guide test strips.   Use as instructed 100 each 1  ? lidocaine (LIDODERM) 5 % APPLY ONCE A DAY TO AFFECTED AREAS AS NEEDED. 12 HOURS ON AND 12 HOURS OFF 30 patch 2  ? LORazepam (ATIVAN) 1 MG tablet Take 1 tablet (1 mg total) by mouth 3 (three) times daily. 90 tablet 1  ? metFORMIN (GLUCOPHAGE) 500 MG tablet Take 2 tablets (1,000 mg total) by mouth 2 (two) times daily. 180 tablet 11  ? Multiple Vitamins-Minerals (HAIR/SKIN/NAILS) TABS Take 3-4 tablets by mouth daily.    ? QUEtiapine (SEROQUEL XR) 400 MG 24 hr tablet Take 1 tablet (400 mg total) by mouth at bedtime. 90 tablet 3  ? QUEtiapine (SEROQUEL) 100 MG tablet Take 1 tablet (100 mg total) by mouth in the morning. 90 tablet 3  ? QUEtiapine (SEROQUEL) 400 MG tablet TAKE 1 TABLET BY MOUTH EVERYDAY AT BEDTIME 90 tablet 3  ? ondansetron (ZOFRAN) 4 MG tablet TAKE 1 TABLET BY MOUTH EVERY 8 HOURS AS NEEDED FOR NAUSEA AND VOMITING 20 tablet 0  ? zolpidem (AMBIEN) 5 MG tablet Take 1 tablet (5 mg total) by mouth at bedtime as needed for sleep. 15 tablet 1  ? [START ON 05/17/2022] Semaglutide 7 MG TABS Take 7 mg by mouth daily at 12 noon. (Patient not taking: Reported on 03/11/2022) 90 tablet 3  ? telmisartan (MICARDIS) 20 MG tablet Take 1 tablet (20 mg total) by mouth daily. (Patient not taking: Reported on 03/11/2022) 90 tablet 3  ? ?No facility-administered medications prior to visit.  ? ? ?Allergies  ?Allergen Reactions  ? Shellfish Allergy Shortness Of Breath  ? Naloxone Diarrhea and Nausea And Vomiting  ? ?Objective:  ? ?Today's Vitals  ? 03/11/22 1408  ?BP: 128/76  ?Pulse: 90  ?Temp: 98 ?F (36.7 ?C)  ?TempSrc: Temporal  ?SpO2: 95%  ?Weight:  253 lb (114.8 kg)  ?Height: 5' 6.5" (1.689 m)  ? ?Body mass index is 40.22 kg/m?.  ? ?General: Well developed, well nourished. No acute distress. ?Psych: Alert and oriented. Normal mood and affect. ? ?Health Maintenance Due  ?Topic Date Due  ? PAP SMEAR-Modifier  Never done  ? COLONOSCOPY (Pts 45-13yrs Insurance coverage will need to be confirmed)  Never done  ?   ?Assessment & Plan:  ? ?1. Type 2 diabetes mellitus with diabetic neuropathy, without long-term current use of insulin ([place]) ?Ms. Kropf's last A1c was 8.6. I will have her continue her metofrmin, but add glipizide 2.5 mg bid. I will follow-up with her in 3 months. ? ?- glipiZIDE (GLUCOTROL) 5 MG tablet;  Take 0.5 tablets (2.5 mg total) by mouth 2 (two) times daily before a meal.  Dispense: 90 tablet; Refill: 3 ? ?2. Essential hypertension ?Ms. Hogland's blood pressure is in excellent control. I will switch her to irbesartan (formulary med) and we will reassess her blood pressure at her next visit. ?- irbesartan (AVAPRO) 75 MG tablet; Take 1 tablet (75 mg total) by mouth daily.  Dispense: 90 tablet; Refill: 3 ? ?3. Primary insomnia ?Continue periodic Ambien use. ? ?- zolpidem (AMBIEN) 5 MG tablet; Take 1 tablet (5 mg total) by mouth at bedtime as needed for sleep.  Dispense: 15 tablet; Refill: 1 ? ?4. Nausea ?Continue ondansetron. Her problem list did include a diagnosis of a prolonged QT. However, I reviewed EKGs from 2016 and 2018, neither of which demonstrated this. She should be safe to continue Zofran. ? ?- ondansetron (ZOFRAN) 4 MG tablet; TAKE 1 TABLET BY MOUTH EVERY 8 HOURS AS NEEDED FOR NAUSEA AND VOMITING  Dispense: 20 tablet; Refill: 0 ? ?Return in about 3 months (around 06/11/2022) for Reassessment.  ? ?Haydee Salter, MD ?

## 2022-03-13 ENCOUNTER — Other Ambulatory Visit: Payer: Self-pay

## 2022-03-13 ENCOUNTER — Telehealth: Payer: Self-pay | Admitting: Family Medicine

## 2022-03-13 DIAGNOSIS — M797 Fibromyalgia: Secondary | ICD-10-CM

## 2022-03-13 MED ORDER — LIDOCAINE 5 % EX PTCH: MEDICATED_PATCH | CUTANEOUS | 2 refills | Status: AC

## 2022-03-13 NOTE — Telephone Encounter (Signed)
Spoke to pharmacy, they are getting the Irbesartan and insurance will only pay for qty #15 per month.  She can get 15 days worth with discount card for $22.28.   ?Patient notified [place] phone. Dm/cma ? ?

## 2022-03-13 NOTE — Telephone Encounter (Signed)
Pt called saying the pharmacy needs prior auth on Ambien & irbesartan (AVAPRO) 75 MG tablet ?

## 2022-03-13 NOTE — Telephone Encounter (Signed)
Spoke to patient and she would like to get a refill on the Lidocaine patches.   ?Please review and advise  ?Thanks Dm/cma ? ?

## 2022-03-14 ENCOUNTER — Telehealth: Payer: Self-pay

## 2022-03-14 NOTE — Telephone Encounter (Signed)
PA for Lidocaine Patches submitted by fax through cover my meds. Awaiting response.  Dm/cma ? ? ?KEY: [id number] ?

## 2022-03-20 ENCOUNTER — Telehealth (HOSPITAL_COMMUNITY): Payer: Medicaid Other | Admitting: Psychiatry

## 2022-03-21 DIAGNOSIS — B009 Herpesviral infection, unspecified: Secondary | ICD-10-CM | POA: Diagnosis not present

## 2022-03-21 DIAGNOSIS — Z720 Tobacco use: Secondary | ICD-10-CM | POA: Diagnosis not present

## 2022-03-31 ENCOUNTER — Other Ambulatory Visit: Payer: Self-pay | Admitting: Family Medicine

## 2022-03-31 DIAGNOSIS — E119 Type 2 diabetes mellitus without complications: Secondary | ICD-10-CM

## 2022-04-01 ENCOUNTER — Other Ambulatory Visit: Payer: Self-pay | Admitting: Family Medicine

## 2022-04-01 DIAGNOSIS — F411 Generalized anxiety disorder: Secondary | ICD-10-CM

## 2022-04-02 NOTE — Telephone Encounter (Signed)
2nd no show

## 2022-04-19 ENCOUNTER — Telehealth: Payer: Self-pay | Admitting: Family Medicine

## 2022-04-19 NOTE — Telephone Encounter (Signed)
Pt stated that Dr Veto Kemps had mentioned about Ketamine treatments but she hasn't heard anything and she would like to take this route. Please advise.

## 2022-04-23 ENCOUNTER — Telehealth: Payer: Self-pay | Admitting: Family Medicine

## 2022-04-23 NOTE — Telephone Encounter (Signed)
Spoke to patient,  wants to know if you know who she was for they Cystitis/fibromyalgia pain that could do the Ketamine treatments. Please review and advise.  Thanks. Dm/cma

## 2022-04-23 NOTE — Telephone Encounter (Signed)
Lft VM to rtn call. Dm/cma  

## 2022-04-23 NOTE — Telephone Encounter (Signed)
Pt returned your call . Today @ 4.20 please give pt a call back

## 2022-04-25 NOTE — Telephone Encounter (Signed)
Will discuss at appointment 04/26/22. Dm/cma

## 2022-04-26 ENCOUNTER — Other Ambulatory Visit: Payer: Self-pay | Admitting: Family Medicine

## 2022-04-26 ENCOUNTER — Other Ambulatory Visit (HOSPITAL_COMMUNITY)
Admission: RE | Admit: 2022-04-26 | Discharge: 2022-04-26 | Disposition: A | Discharge status: Home / Self Care | Payer: Medicaid Other | Source: Ambulatory Visit | Attending: Family Medicine | Admitting: Family Medicine

## 2022-04-26 ENCOUNTER — Ambulatory Visit: Payer: Medicaid Other | Admitting: Family Medicine

## 2022-04-26 VITALS — BP 122/78 | HR 76 | Temp 98.0°F | Ht 66.5 in | Wt 249.4 lb

## 2022-04-26 DIAGNOSIS — Z124 Encounter for screening for malignant neoplasm of cervix: Secondary | ICD-10-CM | POA: Insufficient documentation

## 2022-04-26 DIAGNOSIS — J4520 Mild intermittent asthma, uncomplicated: Secondary | ICD-10-CM | POA: Insufficient documentation

## 2022-04-26 DIAGNOSIS — F5101 Primary insomnia: Secondary | ICD-10-CM | POA: Diagnosis not present

## 2022-04-26 DIAGNOSIS — F319 Bipolar disorder, unspecified: Secondary | ICD-10-CM

## 2022-04-26 DIAGNOSIS — E1140 Type 2 diabetes mellitus with diabetic neuropathy, unspecified: Secondary | ICD-10-CM

## 2022-04-26 DIAGNOSIS — K219 Gastro-esophageal reflux disease without esophagitis: Secondary | ICD-10-CM

## 2022-04-26 MED ORDER — ZOLPIDEM TARTRATE 5 MG PO TABS: 5.0000 mg | ORAL_TABLET | Freq: Every evening | ORAL | 1 refills | Status: DC | PRN

## 2022-04-26 MED ORDER — PIOGLITAZONE HCL 15 MG PO TABS: 15.0000 mg | ORAL_TABLET | Freq: Every day | ORAL | 3 refills | Status: DC

## 2022-04-26 MED ORDER — ESOMEPRAZOLE MAGNESIUM 40 MG PO CPDR: 40.0000 mg | DELAYED_RELEASE_CAPSULE | Freq: Two times a day (BID) | ORAL | 3 refills | Status: DC

## 2022-04-26 MED ORDER — ALBUTEROL SULFATE HFA 108 (90 BASE) MCG/ACT IN AERS: 2.0000 | INHALATION_SPRAY | Freq: Four times a day (QID) | RESPIRATORY_TRACT | 2 refills | Status: DC | PRN

## 2022-04-26 NOTE — Progress Notes (Signed)
[facility] [address] [place] [address] Dept: [phone] Dept Fax: [phone]  Chronic Care Office Visit  Subjective:    Patient ID: Brandi Welch, female    DOB: 12-Mar-1976, 46 y.o..   MRN: [id number]  Chief Complaint  Patient presents with   Follow-up    F/u  and pap today.     History of Present Illness:  Patient is in today for reassessment of chronic medical issues.  [name] has a history of Type 2 diabetes. She has been managed on metformin 500 mg two tabs twice a day. She had been on Rybelsus, but her insurance changed, so she can no longer afford that. At her last visit in may, I switched her to glipizide. She notes she did not like the way this made her feel, so she has stopped the medication.  [name] is overdue for her pap smear. She is ready to complete this today.  [name] requests several refills.  Past Medical History: Patient Active Problem List   Diagnosis Date Noted   Mild intermittent asthma without complication 04/26/2022   Primary insomnia 04/26/2022   Diabetic neuropathy ([facility]) 10/10/2021   Gastroesophageal reflux disease without esophagitis 07/17/2021   Class 3 severe obesity due to excess calories with serious comorbidity and body mass index (BMI) of 40.0 to 44.9 in adult ([facility]) 02/07/2021   Bipolar disorder ([facility]) 01/10/2021   Zoster 01/10/2021   Type 2 diabetes mellitus with diabetic neuropathy, unspecified ([facility]) 01/10/2021   Vitamin D deficiency 01/10/2021   Chronic constipation 01/10/2021   Hematochezia 01/10/2021   History of substance abuse ([facility]) 05/04/2018   Hyperlipidemia 05/04/2018   AKI (acute kidney injury) ([facility]) 05/04/2018   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   History of repeated overdose 10/08/2017   Chronic migraine 08/06/2017   History of opioid abuse ([facility]) 01/13/2016   Osteoarthritis 04/12/2015   Allergic rhinitis 04/12/2015    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   Interstitial cystitis (chronic) without hematuria 06/21/2014   Generalized anxiety disorder 11/30/2012   Low back pain 01/22/2012   Myofascial pain 01/22/2012   Essential hypertension 12/18/2011   Fibromyalgia 06/27/2011   Past Surgical History:  Procedure Laterality Date   BRAIN SURGERY     CERVICAL BIOPSY  W/ LOOP ELECTRODE EXCISION     NASAL SEPTUM SURGERY     NOSE SURGERY     Family History  Problem Relation Age of Onset   Hypertension Mother    Hyperlipidemia Mother    Asthma Mother    Arthritis Mother    Diabetes Father    Hypertension Father    Hyperlipidemia Father    Stroke Maternal Grandmother    Outpatient Medications Prior to Visit  Medication Sig Dispense Refill   Accu-Chek Softclix Lancets lancets Use as instructed 100 each 12   acetaminophen (TYLENOL) 500 MG tablet Take 1,000 mg by mouth every 6 (six) hours as needed for mild pain, moderate pain, fever or headache.     acyclovir (ZOVIRAX) 200 MG capsule TAKE 1 CAPSULE BY MOUTH TWICE A DAY 180 capsule 3   atorvastatin (LIPITOR) 40 MG tablet TAKE 1 TABLET BY MOUTH EVERY DAY 90 tablet 3   bimatoprost (LATISSE) 0.03 % ophthalmic solution Place 1 drop into both eyes 2 times daily. Place one drop on applicator and apply evenly along the skin of the upper eyelid at base of eyelashes once daily at bedtime; repeat procedure for second eye (use a clean applicator).  Blood Glucose Monitoring Suppl (BLOOD GLUCOSE MONITOR SYSTEM) w/Device KIT 1 Units by Does not apply route daily. DX: Diabetes;  please fill with whichever one insurance covers. 1 kit 0   cetirizine (ZYRTEC ALLERGY) 10 MG tablet Take 10 mg by mouth daily.     cholecalciferol (VITAMIN D) 1000 units tablet Take 2,000 Units by mouth daily.     cloNIDine (CATAPRES) 0.2 MG tablet Take 1 tablet (0.2 mg total) by mouth 3 (three) times daily. 180 tablet 3   gabapentin (NEURONTIN) 300 MG capsule Take 300 mg by mouth 3  (three) times daily.     glipiZIDE (GLUCOTROL) 5 MG tablet Take 0.5 tablets (2.5 mg total) by mouth 2 (two) times daily before a meal. 90 tablet 3   glucose blood test strip 1 each by Other route as needed for other. Accu- check Guide test strips.   Use as instructed 100 each 1   irbesartan (AVAPRO) 75 MG tablet Take 1 tablet (75 mg total) by mouth daily. 90 tablet 3   LORazepam (ATIVAN) 1 MG tablet TAKE 1 TABLET BY MOUTH 3 TIMES DAILY. 90 tablet 1   metFORMIN (GLUCOPHAGE) 500 MG tablet TAKE 2 TABLETS BY MOUTH TWICE A DAY 360 tablet 5   Multiple Vitamins-Minerals (HAIR/SKIN/NAILS) TABS Take 3-4 tablets by mouth daily.     ondansetron (ZOFRAN) 4 MG tablet TAKE 1 TABLET BY MOUTH EVERY 8 HOURS AS NEEDED FOR NAUSEA AND VOMITING 20 tablet 0   QUEtiapine (SEROQUEL XR) 400 MG 24 hr tablet Take 1 tablet (400 mg total) by mouth at bedtime. 90 tablet 3   QUEtiapine (SEROQUEL) 100 MG tablet Take 1 tablet (100 mg total) by mouth in the morning. 90 tablet 3   QUEtiapine (SEROQUEL) 400 MG tablet TAKE 1 TABLET BY MOUTH EVERYDAY AT BEDTIME 90 tablet 3   albuterol (VENTOLIN HFA) 108 (90 Base) MCG/ACT inhaler      esomeprazole (NEXIUM) 40 MG capsule Take 1 capsule (40 mg total) by mouth 2 (two) times daily before a meal. 60 capsule 3   zolpidem (AMBIEN) 5 MG tablet Take 1 tablet (5 mg total) by mouth at bedtime as needed for sleep. 15 tablet 1   lidocaine (LIDODERM) 5 % Remove & Discard patch within 12 hours or as directed by MD (Patient not taking: Reported on 04/26/2022) 30 patch 2   No facility-administered medications prior to visit.   Allergies  Allergen Reactions   Shellfish Allergy Shortness Of Breath   Naloxone Diarrhea and Nausea And Vomiting     Objective:   Today's Vitals   04/26/22 1523  BP: 122/78  Pulse: 76  Temp: 98 F (36.7 C)  TempSrc: Temporal  SpO2: 97%  Weight: 249 lb 6.4 oz (113.1 kg)  Height: 5' 6.5" (1.689 m)   Body mass index is 39.65 kg/m.   General: Well developed, well  nourished. No acute distress. GU: Normal external genitalia. Vaginal vault is pink without discharge. Cervix is pink and appears normal. No CMT. Uterus normal   size. No adnexal masses. No anterior or posterior wall prolapse. Psych: Alert and oriented. Normal mood and affect.  Health Maintenance Due  Topic Date Due   PAP SMEAR-Modifier  Never done   COLONOSCOPY (Pts 45-30yrs Insurance coverage will need to be confirmed)  Never done     Assessment & Plan:   1. Type 2 diabetes mellitus with diabetic neuropathy, without long-term current use of insulin ([facility]) As her last A1c was above goal, I am concerned about her  having stopped her glipizide. I will try adding pioglitazone to her metformin to see if this will help get her back under control.  - pioglitazone (ACTOS) 15 MG tablet; Take 1 tablet (15 mg total) by mouth daily.  Dispense: 30 tablet; Refill: 3  2. Mild intermittent asthma without complication Uses intermittent albuterol. Had recent flare due to environmental smoke. I will renew this.  - albuterol (VENTOLIN HFA) 108 (90 Base) MCG/ACT inhaler; Inhale 2 puffs into the lungs every 6 (six) hours as needed for wheezing or shortness of breath.  Dispense: 8 g; Refill: 2  3. Gastroesophageal reflux disease without esophagitis Stable on Nexium  - esomeprazole (NEXIUM) 40 MG capsule; Take 1 capsule (40 mg total) by mouth 2 (two) times daily before a meal.  Dispense: 60 capsule; Refill: 3  4. Primary insomnia Takes Ambien for sleep.This continues to work well for her.  - zolpidem (AMBIEN) 5 MG tablet; Take 1 tablet (5 mg total) by mouth at bedtime as needed for sleep.  Dispense: 15 tablet; Refill: 1  5. Screening for cervical cancer  - Cytology - PAP  Return in about 2 months (around 06/26/2022) for Reassessment.   Loyola Mast, MD

## 2022-04-29 ENCOUNTER — Other Ambulatory Visit: Payer: Self-pay | Admitting: Family Medicine

## 2022-04-29 ENCOUNTER — Telehealth: Payer: Self-pay | Admitting: Family Medicine

## 2022-04-29 DIAGNOSIS — K219 Gastro-esophageal reflux disease without esophagitis: Secondary | ICD-10-CM

## 2022-04-29 DIAGNOSIS — F5101 Primary insomnia: Secondary | ICD-10-CM

## 2022-04-29 DIAGNOSIS — E782 Mixed hyperlipidemia: Secondary | ICD-10-CM

## 2022-04-29 DIAGNOSIS — F411 Generalized anxiety disorder: Secondary | ICD-10-CM

## 2022-04-29 DIAGNOSIS — J4520 Mild intermittent asthma, uncomplicated: Secondary | ICD-10-CM

## 2022-04-29 DIAGNOSIS — E1140 Type 2 diabetes mellitus with diabetic neuropathy, unspecified: Secondary | ICD-10-CM

## 2022-04-30 ENCOUNTER — Other Ambulatory Visit: Payer: Self-pay | Admitting: Family Medicine

## 2022-04-30 DIAGNOSIS — R110 Nausea: Secondary | ICD-10-CM

## 2022-05-01 LAB — CYTOLOGY - PAP
Comment: NEGATIVE
Diagnosis: NEGATIVE
High risk HPV: NEGATIVE

## 2022-05-02 ENCOUNTER — Other Ambulatory Visit: Payer: Self-pay

## 2022-05-02 DIAGNOSIS — R110 Nausea: Secondary | ICD-10-CM

## 2022-05-02 MED ORDER — QUETIAPINE FUMARATE 400 MG PO TABS: ORAL_TABLET | ORAL | 0 refills | Status: DC

## 2022-05-02 MED ORDER — PIOGLITAZONE HCL 15 MG PO TABS: 15.0000 mg | ORAL_TABLET | Freq: Every day | ORAL | 3 refills | Status: DC

## 2022-05-02 MED ORDER — LORAZEPAM 1 MG PO TABS: 1.0000 mg | ORAL_TABLET | Freq: Three times a day (TID) | ORAL | 0 refills | Status: DC

## 2022-05-02 MED ORDER — ONDANSETRON HCL 4 MG PO TABS: ORAL_TABLET | ORAL | 0 refills | Status: DC

## 2022-05-02 MED ORDER — ESOMEPRAZOLE MAGNESIUM 40 MG PO CPDR: 40.0000 mg | DELAYED_RELEASE_CAPSULE | Freq: Two times a day (BID) | ORAL | 3 refills | Status: AC

## 2022-05-02 MED ORDER — ALBUTEROL SULFATE HFA 108 (90 BASE) MCG/ACT IN AERS: 2.0000 | INHALATION_SPRAY | Freq: Four times a day (QID) | RESPIRATORY_TRACT | 2 refills | Status: AC | PRN

## 2022-05-02 MED ORDER — ZOLPIDEM TARTRATE 5 MG PO TABS: 5.0000 mg | ORAL_TABLET | Freq: Every evening | ORAL | 0 refills | Status: DC | PRN

## 2022-05-02 MED ORDER — ATORVASTATIN CALCIUM 40 MG PO TABS: 40.0000 mg | ORAL_TABLET | Freq: Every day | ORAL | 0 refills | Status: DC

## 2022-05-02 NOTE — Telephone Encounter (Signed)
Pt is having a lot of trouble getting her scripts from her current pharmacy   [id number] - [place], [place] - [address] AT [facility] [address]  [address] [place]  [address]  Phone:  [phone]  Fax:  [phone]  DEA #:  [id number]  Please advise pt at [phone]

## 2022-05-02 NOTE — Addendum Note (Signed)
Addended by: Waymond Cera on: 05/02/2022 01:50 PM   Modules accepted: Orders

## 2022-05-02 NOTE — Telephone Encounter (Signed)
Spoke to pharmacy again and they are still getting a message that provider is not covered by Medicaid.  Spoke to [place] and was advised that it take a while. Can you please and thank you send in her medications till this is fixed? Thanks. Dm/cma

## 2022-05-02 NOTE — Telephone Encounter (Signed)
Spoke to patient. Please see other message.  Thanks. Dm/cma

## 2022-05-02 NOTE — Telephone Encounter (Signed)
Patient aware and will go the pharmacy tonight. Dm/cma

## 2022-05-16 DIAGNOSIS — M797 Fibromyalgia: Secondary | ICD-10-CM | POA: Diagnosis not present

## 2022-05-16 DIAGNOSIS — N3010 Interstitial cystitis (chronic) without hematuria: Secondary | ICD-10-CM | POA: Diagnosis not present

## 2022-05-16 DIAGNOSIS — R3989 Other symptoms and signs involving the genitourinary system: Secondary | ICD-10-CM | POA: Diagnosis not present

## 2022-05-22 NOTE — Progress Notes (Deleted)
NEUROLOGY CONSULTATION NOTE  Brandi Welch MRN: [id number] DOB: 05-15-1976  Referring provider: Arlester Marker, MD Primary care provider: Arlester Marker, MD  Reason for consult:  trigeminal neuralgia  Assessment/Plan:   ***   Subjective:  Brandi Welch is a 46 year old female with DMI II, HTN, hypothyroidism, and fibromyalgia who presents for right-sided trigeminal neuralgia.  History supplemented by prior neurologist's and referring provider's notes.  She began having right sided facial pain ***.  At first it was diagnosed as ocular neuralgia as it ***.  Symptoms got worse around 2018, in which distribution spread to the upper cheek as well, and was thus diagnosed with trigeminal neuralgia.  Described as severe sharp paroxysmal shooting pain *** as well as hyperesthesia of the right V1 and V2 divisions.  ***.  MRI of brain without contrast on 08/19/2017 revealed normal brain with right superior cerebellar artery in close proximity to the disternal segment of the right trigeminal nerve.  Previously treated by neurologists at [facility] and [place].     Current treatment/medications:  gabapentin 338m TID; for anxiety, she takes lorazepam 113mTID and quetiapine 10059mn AM and 400m38m bedtime; Ambien at bedtime Past treatments/medications:  Lidoderm 5% , Lyrica, nortriptyline, tramadol, hydrocodone  Other pertinent history:  She has psychiatric history including Bipolar disorder, depression and anxiety.  History of sinus surgery.  She has fibromyalgia under care of pain management.  History of substance abuse (opioid overdose, benzodiazepine overdose, baclofen overdose, TCA poisoning and cocaine intoxication).  PAST MEDICAL HISTORY: Past Medical History:  Diagnosis Date   Anxiety    Arthritis    Baclofen overdose 01/03/2018   Benzodiazepine overdose 01/11/2016   Cocaine intoxication (HCC)[place]1/2019   Fibromyalgia    Hypothyroidism 03/12/2012   IC  (interstitial cystitis)    Knee pain, chronic    Opioid overdose (HCC)[place]9/2017   Poisoning by tricyclic antidepressants 3/9/2/8/7867lcer     PAST SURGICAL HISTORY: Past Surgical History:  Procedure Laterality Date   BRAIN SURGERY     CERVICAL BIOPSY  W/ LOOP ELECTRODE EXCISION     NASAL SEPTUM SURGERY     NOSE SURGERY      MEDICATIONS: Current Outpatient Medications on File Prior to Visit  Medication Sig Dispense Refill   Accu-Chek Softclix Lancets lancets Use as instructed 100 each 12   acetaminophen (TYLENOL) 500 MG tablet Take 1,000 mg by mouth every 6 (six) hours as needed for mild pain, moderate pain, fever or headache.     acyclovir (ZOVIRAX) 200 MG capsule TAKE 1 CAPSULE BY MOUTH TWICE A DAY 180 capsule 3   albuterol (VENTOLIN HFA) 108 (90 Base) MCG/ACT inhaler Inhale 2 puffs into the lungs every 6 (six) hours as needed for wheezing or shortness of breath. 8 g 2   atorvastatin (LIPITOR) 40 MG tablet Take 1 tablet (40 mg total) by mouth daily. 90 tablet 0   bimatoprost (LATISSE) 0.03 % ophthalmic solution Place 1 drop into both eyes 2 times daily. Place one drop on applicator and apply evenly along the skin of the upper eyelid at base of eyelashes once daily at bedtime; repeat procedure for second eye (use a clean applicator).     Blood Glucose Monitoring Suppl (BLOOD GLUCOSE MONITOR SYSTEM) w/Device KIT 1 Units by Does not apply route daily. DX: Diabetes;  please fill with whichever one insurance covers. 1 kit 0   cetirizine (ZYRTEC ALLERGY) 10 MG tablet Take 10 mg by mouth  daily.     cholecalciferol (VITAMIN D) 1000 units tablet Take 2,000 Units by mouth daily.     cloNIDine (CATAPRES) 0.2 MG tablet Take 1 tablet (0.2 mg total) by mouth 3 (three) times daily. 180 tablet 3   esomeprazole (NEXIUM) 40 MG capsule Take 1 capsule (40 mg total) by mouth 2 (two) times daily before a meal. 60 capsule 3   gabapentin (NEURONTIN) 300 MG capsule Take 300 mg by mouth 3 (three) times daily.      glipiZIDE (GLUCOTROL) 5 MG tablet Take 0.5 tablets (2.5 mg total) by mouth 2 (two) times daily before a meal. 90 tablet 3   glucose blood test strip 1 each by Other route as needed for other. Accu- check Guide test strips.   Use as instructed 100 each 1   irbesartan (AVAPRO) 75 MG tablet Take 1 tablet (75 mg total) by mouth daily. 90 tablet 3   lidocaine (LIDODERM) 5 % Remove & Discard patch within 12 hours or as directed by MD (Patient not taking: Reported on 04/26/2022) 30 patch 2   LORazepam (ATIVAN) 1 MG tablet Take 1 tablet (1 mg total) by mouth 3 (three) times daily. 90 tablet 0   metFORMIN (GLUCOPHAGE) 500 MG tablet TAKE 2 TABLETS BY MOUTH TWICE A DAY 360 tablet 5   Multiple Vitamins-Minerals (HAIR/SKIN/NAILS) TABS Take 3-4 tablets by mouth daily.     ondansetron (ZOFRAN) 4 MG tablet TAKE 1 TABLET BY MOUTH EVERY 8 HOURS AS NEEDED FOR NAUSEA AND VOMITING 20 tablet 0   pioglitazone (ACTOS) 15 MG tablet Take 1 tablet (15 mg total) by mouth daily. 30 tablet 3   QUEtiapine (SEROQUEL XR) 400 MG 24 hr tablet Take 1 tablet (400 mg total) by mouth at bedtime. 90 tablet 3   QUEtiapine (SEROQUEL) 100 MG tablet Take 1 tablet (100 mg total) by mouth in the morning. 90 tablet 3   QUEtiapine (SEROQUEL) 400 MG tablet TAKE 1 TABLET BY MOUTH EVERYDAY AT BEDTIME 90 tablet 0   zolpidem (AMBIEN) 5 MG tablet Take 1 tablet (5 mg total) by mouth at bedtime as needed for sleep. 15 tablet 0   No current facility-administered medications on file prior to visit.    ALLERGIES: Allergies  Allergen Reactions   Shellfish Allergy Shortness Of Breath   Naloxone Diarrhea and Nausea And Vomiting    FAMILY HISTORY: Family History  Problem Relation Age of Onset   Hypertension Mother    Hyperlipidemia Mother    Asthma Mother    Arthritis Mother    Diabetes Father    Hypertension Father    Hyperlipidemia Father    Stroke Maternal Grandmother     Objective:  *** General: No acute distress.  Patient appears  well-groomed.   Head:  Normocephalic/atraumatic Eyes:  fundi examined but not visualized Neck: supple, no paraspinal tenderness, full range of motion Back: No paraspinal tenderness Heart: regular rate and rhythm Lungs: Clear to auscultation bilaterally. Vascular: No carotid bruits. Neurological Exam: Mental status: alert and oriented to person, place, and time, speech fluent and not dysarthric, language intact. Cranial nerves: CN I: not tested CN II: pupils equal, round and reactive to light, visual fields intact CN III, IV, VI:  full range of motion, no nystagmus, no ptosis CN V: facial sensation intact. CN VII: upper and lower face symmetric CN VIII: hearing intact CN IX, X: gag intact, uvula midline CN XI: sternocleidomastoid and trapezius muscles intact CN XII: tongue midline Bulk & Tone: normal, no fasciculations. Motor:  muscle strength 5/5 throughout Sensation:  Pinprick, temperature and vibratory sensation intact. Deep Tendon Reflexes:  2+ throughout,  toes downgoing.   Finger to nose testing:  Without dysmetria.   Heel to shin:  Without dysmetria.   Gait:  Normal station and stride.  Romberg negative.    Thank you for allowing me to take part in the care of this patient.  Metta Clines, DO  CC: Arlester Marker, MD

## 2022-05-23 ENCOUNTER — Encounter: Payer: Self-pay | Admitting: Neurology

## 2022-05-23 ENCOUNTER — Ambulatory Visit: Payer: Medicaid Other | Admitting: Neurology

## 2022-05-23 DIAGNOSIS — Z029 Encounter for administrative examinations, unspecified: Secondary | ICD-10-CM

## 2022-05-30 ENCOUNTER — Other Ambulatory Visit: Payer: Self-pay | Admitting: Family Medicine

## 2022-05-30 DIAGNOSIS — F411 Generalized anxiety disorder: Secondary | ICD-10-CM

## 2022-05-30 NOTE — Telephone Encounter (Signed)
Caller Name: Nayelie Gionfriddo Call back phone #: [phone]  Reason for Call: PT called is to ask that we refill her [facility] to [facility] ([address], [place], [place] [address]). She also asked if she could receive a call to speak with her about results from a pap smear she recently had.

## 2022-05-31 ENCOUNTER — Other Ambulatory Visit: Payer: Self-pay | Admitting: Family Medicine

## 2022-05-31 DIAGNOSIS — F319 Bipolar disorder, unspecified: Secondary | ICD-10-CM

## 2022-05-31 MED ORDER — LORAZEPAM 1 MG PO TABS: 1.0000 mg | ORAL_TABLET | Freq: Three times a day (TID) | ORAL | 2 refills | Status: DC

## 2022-05-31 NOTE — Telephone Encounter (Signed)
Unable to leave VM due to mail box is full.  Dm/cma  

## 2022-05-31 NOTE — Telephone Encounter (Signed)
Refill request for  Lorazepam 1 mg LR 05/02/22, #90, 0 refill.  LOV 04/26/22 FOV 06/26/22    Please review and advise.  Thanks. Dm/cma

## 2022-06-09 ENCOUNTER — Other Ambulatory Visit: Payer: Self-pay | Admitting: Family Medicine

## 2022-06-09 DIAGNOSIS — E782 Mixed hyperlipidemia: Secondary | ICD-10-CM

## 2022-06-09 DIAGNOSIS — I10 Essential (primary) hypertension: Secondary | ICD-10-CM

## 2022-06-12 ENCOUNTER — Other Ambulatory Visit: Payer: Self-pay | Admitting: Family Medicine

## 2022-06-12 DIAGNOSIS — F5101 Primary insomnia: Secondary | ICD-10-CM

## 2022-06-12 DIAGNOSIS — R110 Nausea: Secondary | ICD-10-CM

## 2022-06-12 NOTE — Telephone Encounter (Signed)
Refill requests for Ambien 5 mg LR 04/26/22, #15, 0 rf  Ondansetron 4 mg LR 05/02/22,#20, 0 rf LOV 04/26/22 FOV 06/26/22  Please review and advise.  Thanks. Dm/cma

## 2022-06-13 ENCOUNTER — Encounter: Payer: Medicaid Other | Attending: Physical Medicine & Rehabilitation | Admitting: Physical Medicine & Rehabilitation

## 2022-06-26 ENCOUNTER — Ambulatory Visit: Payer: Medicaid Other | Admitting: Family Medicine

## 2022-07-09 ENCOUNTER — Telehealth: Payer: Self-pay | Admitting: Family Medicine

## 2022-07-09 ENCOUNTER — Ambulatory Visit: Payer: Medicaid Other | Admitting: Family Medicine

## 2022-07-09 NOTE — Telephone Encounter (Signed)
Pt was a no show for her 07/09/22 appointment with Dr. Veto Kemps for an OV, I sent a letter.

## 2022-07-12 NOTE — Telephone Encounter (Signed)
2nd no show w/in 12 months, unable to charge fee [facility] insurance), sent letter

## 2022-07-15 ENCOUNTER — Other Ambulatory Visit: Payer: Self-pay | Admitting: Family Medicine

## 2022-07-24 ENCOUNTER — Ambulatory Visit: Payer: Medicaid Other | Admitting: Family Medicine

## 2022-07-24 ENCOUNTER — Encounter: Payer: Self-pay | Admitting: Family Medicine

## 2022-07-24 VITALS — BP 116/78 | HR 91 | Temp 97.8°F | Ht 66.5 in | Wt 250.6 lb

## 2022-07-24 DIAGNOSIS — I10 Essential (primary) hypertension: Secondary | ICD-10-CM | POA: Diagnosis not present

## 2022-07-24 DIAGNOSIS — M25561 Pain in right knee: Secondary | ICD-10-CM | POA: Diagnosis not present

## 2022-07-24 DIAGNOSIS — F411 Generalized anxiety disorder: Secondary | ICD-10-CM | POA: Diagnosis not present

## 2022-07-24 DIAGNOSIS — Z1211 Encounter for screening for malignant neoplasm of colon: Secondary | ICD-10-CM | POA: Diagnosis not present

## 2022-07-24 DIAGNOSIS — G8929 Other chronic pain: Secondary | ICD-10-CM | POA: Diagnosis not present

## 2022-07-24 DIAGNOSIS — J0100 Acute maxillary sinusitis, unspecified: Secondary | ICD-10-CM

## 2022-07-24 DIAGNOSIS — E1140 Type 2 diabetes mellitus with diabetic neuropathy, unspecified: Secondary | ICD-10-CM | POA: Diagnosis not present

## 2022-07-24 DIAGNOSIS — M25562 Pain in left knee: Secondary | ICD-10-CM | POA: Diagnosis not present

## 2022-07-24 NOTE — Progress Notes (Signed)
[facility] [address] [place] [address] Dept: [phone] Dept Fax: [phone]  Chronic Care Office Visit  Subjective:    Patient ID: Brandi Welch, female    DOB: 05-16-1976, 46 y.o..   MRN: [id number]  Chief Complaint  Patient presents with   Follow-up    F/u Diabetes.  Average BS 99-200.      History of Present Illness:  Patient is in today for reassessment of chronic medical issues.  Ms. Tallo has a history of Type 2 diabetes. She has been managed on metformin 1,000 mg twice a day and pioglitazone 15 mg daily. The pioglitazone was a new medication started at her last visit because of a change in her insurance.  Ms. Ricklefs also has a history of hypertension. She is managed on irbesartan 75 mg daily.  Ms. Delbridge has a history of bipolar disorder and anxiety. She is managed on quetiapine 100 mg each morning and 400 mg qhs. She also takes lorazepam 1 mg as needed for panic attacks. she describes panic that occurs at night, making her feel like she can't breathe. She is eventually able to calm this down, though it is quite disturbing at the time. These episodes are occurring about twice a month.  Ms. Severs does complain of some pain in both knees and ankles. She notes this limits her ability to exercise regularly. She has a history of fibromyalgia and myofascial pain. she is managed on gabapentin.  Past Medical History: Patient Active Problem List   Diagnosis Date Noted   Mild intermittent asthma without complication 32/99/2426   Primary insomnia 04/26/2022   Diabetic neuropathy ([place]) 10/10/2021   Gastroesophageal reflux disease without esophagitis 07/17/2021   Class 3 severe obesity due to excess calories with serious comorbidity and body mass index (BMI) of 40.0 to 44.9 in adult ([place]) 02/07/2021   Bipolar disorder ([place]) 01/10/2021   Zoster 01/10/2021   Type 2 diabetes mellitus with diabetic  neuropathy, unspecified ([place]) 01/10/2021   Vitamin D deficiency 01/10/2021   Chronic constipation 01/10/2021   Hematochezia 01/10/2021   History of substance abuse ([place]) 05/04/2018   Hyperlipidemia 05/04/2018   AKI (acute kidney injury) ([place]) 05/04/2018   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   History of repeated overdose 10/08/2017   Chronic migraine 08/06/2017   History of opioid abuse ([place]) 01/13/2016   Osteoarthritis 04/12/2015   Allergic rhinitis 04/12/2015   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   Interstitial cystitis (chronic) without hematuria 06/21/2014   Generalized anxiety disorder 11/30/2012   Low back pain 01/22/2012   Myofascial pain 01/22/2012   Essential hypertension 12/18/2011   Fibromyalgia 06/27/2011   Past Surgical History:  Procedure Laterality Date   BRAIN SURGERY     CERVICAL BIOPSY  W/ LOOP ELECTRODE EXCISION     NASAL SEPTUM SURGERY     NOSE SURGERY     Family History  Problem Relation Age of Onset   Hypertension Mother    Hyperlipidemia Mother    Asthma Mother    Arthritis Mother    Diabetes Father    Hypertension Father    Hyperlipidemia Father    Stroke Maternal Grandmother    Outpatient Medications Prior to Visit  Medication Sig Dispense Refill   Accu-Chek Softclix Lancets lancets Use as instructed 100 each 12   acetaminophen (TYLENOL) 500 MG tablet Take 1,000 mg by mouth every 6 (six) hours as needed for mild pain, moderate pain, fever or headache.     acyclovir (ZOVIRAX) 200  MG capsule TAKE 1 CAPSULE BY MOUTH TWICE A DAY 180 capsule 3   albuterol (VENTOLIN HFA) 108 (90 Base) MCG/ACT inhaler Inhale 2 puffs into the lungs every 6 (six) hours as needed for wheezing or shortness of breath. 8 g 2   atorvastatin (LIPITOR) 40 MG tablet TAKE 1 TABLET BY MOUTH EVERY DAY 90 tablet 3   bimatoprost (LATISSE) 0.03 % ophthalmic solution Place 1 drop into both eyes 2 times daily. Place one drop on applicator and apply evenly along the skin of  the upper eyelid at base of eyelashes once daily at bedtime; repeat procedure for second eye (use a clean applicator).     Blood Glucose Monitoring Suppl (BLOOD GLUCOSE MONITOR SYSTEM) w/Device KIT 1 Units by Does not apply route daily. DX: Diabetes;  please fill with whichever one insurance covers. 1 kit 0   cetirizine (ZYRTEC) 10 MG tablet Take 1 tablet by mouth daily.     cholecalciferol (VITAMIN D) 1000 units tablet Take 2,000 Units by mouth daily.     cloNIDine (CATAPRES) 0.2 MG tablet TAKE 1 TABLET BY MOUTH 3 TIMES DAILY. 270 tablet 3   diazepam (DIASTAT) 2.5 MG GEL Place 1 mg rectally once.     ELMIRON 100 MG capsule Take 100 mg by mouth 3 (three) times daily.     esomeprazole (NEXIUM) 40 MG capsule Take 1 capsule (40 mg total) by mouth 2 (two) times daily before a meal. 60 capsule 3   gabapentin (NEURONTIN) 300 MG capsule Take 300 mg by mouth 3 (three) times daily.     glipiZIDE (GLUCOTROL) 5 MG tablet Take 0.5 tablets (2.5 mg total) by mouth 2 (two) times daily before a meal. 90 tablet 3   glucose blood test strip 1 each by Other route as needed for other. Accu- check Guide test strips.   Use as instructed 100 each 1   irbesartan (AVAPRO) 75 MG tablet Take 1 tablet (75 mg total) by mouth daily. 90 tablet 3   lidocaine (LIDODERM) 5 % Remove & Discard patch within 12 hours or as directed by MD 30 patch 2   LORazepam (ATIVAN) 1 MG tablet Take 1 tablet (1 mg total) by mouth 3 (three) times daily. 90 tablet 2   metFORMIN (GLUCOPHAGE) 500 MG tablet TAKE 2 TABLETS BY MOUTH TWICE A DAY 360 tablet 5   Multiple Vitamins-Minerals (HAIR/SKIN/NAILS) TABS Take 3-4 tablets by mouth daily.     ondansetron (ZOFRAN) 4 MG tablet TAKE 1 TABLET BY MOUTH EVERY 8 HOURS AS NEEDED FOR NAUSEA AND VOMITING 20 tablet 4   pioglitazone (ACTOS) 15 MG tablet Take 1 tablet (15 mg total) by mouth daily. 30 tablet 3   QUEtiapine (SEROQUEL XR) 400 MG 24 hr tablet Take 1 tablet (400 mg total) by mouth at bedtime. 90 tablet 3    QUEtiapine (SEROQUEL) 100 MG tablet Take 1 tablet (100 mg total) by mouth in the morning. 90 tablet 3   QUEtiapine (SEROQUEL) 400 MG tablet TAKE 1 TABLET BY MOUTH EVERYDAY AT BEDTIME 90 tablet 0   zolpidem (AMBIEN) 5 MG tablet TAKE 1 TABLET BY MOUTH AT BEDTIME AS NEEDED FOR SLEEP. 15 tablet 2   cetirizine (ZYRTEC ALLERGY) 10 MG tablet Take 10 mg by mouth daily.     No facility-administered medications prior to visit.   Allergies  Allergen Reactions   Shellfish Allergy Shortness Of Breath   Naloxone Diarrhea and Nausea And Vomiting    Objective:   Today's Vitals   07/24/22 1510  BP: 116/78  Pulse: 91  Temp: 97.8 F (36.6 C)  TempSrc: Temporal  SpO2: 95%  Weight: 250 lb 9.6 oz (113.7 kg)  Height: 5' 6.5" (1.689 m)   Body mass index is 39.84 kg/m.   General: Well developed, well nourished. No acute distress. Extremities: No swelling noted to the knee joints. There is no palpable crepitance. Jopint is   stable. Psych: Alert and oriented. Normal mood and affect.  Health Maintenance Due  Topic Date Due   COLONOSCOPY (Pts 45-46yrs Insurance coverage will need to be confirmed)  Never done   INFLUENZA VACCINE  06/04/2022     Assessment & Plan:   1. Type 2 diabetes mellitus with diabetic neuropathy, without long-term current use of insulin ([place]) Diabetes control had slipped this spring, related to multiple medication changes brought about by her insurance change. We will reassess her A1c today. I will continue metformin 1,000 mg bid and pioglitazone 15 mg daily for now.  - Glucose, random - Hemoglobin A1c  2. Essential hypertension Blood pressure is doing well on the new BP med. Continue irbesartan 75 mg daily.  3. Chronic pain of both knees She has had some pain releif with using pain patches, but finds these to be expensive. I recommend she try Voltaren gel, to see if this will help.  4. Generalized anxiety disorder Managing panic attacks appropriately. We reviewed  best approaches to talking herself through these events.  5. Screening for colon cancer  - Cologuard   Return in about 3 months (around 10/23/2022) for Reassessment.   Haydee Salter, MD

## 2022-07-25 ENCOUNTER — Other Ambulatory Visit (INDEPENDENT_AMBULATORY_CARE_PROVIDER_SITE_OTHER): Payer: Medicaid Other

## 2022-07-25 DIAGNOSIS — E1140 Type 2 diabetes mellitus with diabetic neuropathy, unspecified: Secondary | ICD-10-CM | POA: Diagnosis not present

## 2022-07-25 LAB — GLUCOSE, RANDOM: Glucose, Bld: 132 mg/dL — ABNORMAL HIGH (ref 70–99)

## 2022-07-25 MED ORDER — AMOXICILLIN-POT CLAVULANATE 875-125 MG PO TABS: 1.0000 | ORAL_TABLET | Freq: Two times a day (BID) | ORAL | 0 refills | Status: DC

## 2022-07-25 NOTE — Addendum Note (Signed)
Addended by: Haydee Salter on: 07/25/2022 09:06 AM   Modules accepted: Orders

## 2022-07-29 ENCOUNTER — Telehealth: Payer: Self-pay | Admitting: Family Medicine

## 2022-07-29 DIAGNOSIS — G8929 Other chronic pain: Secondary | ICD-10-CM

## 2022-07-29 LAB — HEMOGLOBIN A1C: Hgb A1c MFr Bld: 8.5 % — ABNORMAL HIGH (ref 4.6–6.5)

## 2022-07-29 MED ORDER — PIOGLITAZONE HCL 30 MG PO TABS: 30.0000 mg | ORAL_TABLET | Freq: Every day | ORAL | 3 refills | Status: DC

## 2022-07-29 NOTE — Telephone Encounter (Signed)
Spoke to patient, she has an upcoming appointment with Dr Jeff Desanctis, Sports medicine for knee pain on 07/31/22.   Can you please and thank you send a referral for them to see her.   Thanks. Dm/cma

## 2022-07-29 NOTE — Telephone Encounter (Signed)
Pt stated she was having knee issues and went to see one of her past dr's and need a referral so she can proceed because of pt insurance. Pt said fax has to be sent today because her procedure is on Wednesday : fax is [phone] and attn: to denise C.

## 2022-07-31 DIAGNOSIS — Z1211 Encounter for screening for malignant neoplasm of colon: Secondary | ICD-10-CM | POA: Diagnosis not present

## 2022-08-05 ENCOUNTER — Telehealth: Payer: Self-pay | Admitting: Family Medicine

## 2022-08-05 NOTE — Telephone Encounter (Signed)
Caller Name: [name] Call back phone #: [phone]  Reason for Call: Please call pt to clarify, she says that a referral was sent In for Diabetes with Dr. Haskell Desanctis when it should have been for pain in her knees.

## 2022-08-07 LAB — COLOGUARD: COLOGUARD: POSITIVE — AB

## 2022-08-07 NOTE — Addendum Note (Signed)
Addended by: Haydee Salter on: 08/07/2022 01:02 PM   Modules accepted: Orders

## 2022-08-08 NOTE — Telephone Encounter (Signed)
Spoke to patient, she states that someone in Dr Tipton's office told her that the referral said it was for Diabetes. Looked at referral information and it was sent as Bilateral Knee Pain. Looked up phone number for them  [phone] and they are closed, so will call them tomorrow to see if I can help patient out with getting this appointment. Dm/cma

## 2022-08-09 NOTE — Telephone Encounter (Signed)
Called DR Tipton's office @ [phone], they state that a message was left yesterday for her to call them to schedule an appointment.  I tried calling patients number and VM is full.  Will try later. Dm/cma

## 2022-08-10 ENCOUNTER — Other Ambulatory Visit: Payer: Self-pay | Admitting: Family Medicine

## 2022-08-10 DIAGNOSIS — E1140 Type 2 diabetes mellitus with diabetic neuropathy, unspecified: Secondary | ICD-10-CM

## 2022-08-12 ENCOUNTER — Telehealth: Payer: Self-pay

## 2022-08-12 NOTE — Telephone Encounter (Signed)
PA for Quetiapine Fumarate ER submitted and approved from 08/12/22 - 10/9/-24.  Pharmacy notified [facility] phone. Dm/cma

## 2022-08-13 NOTE — Telephone Encounter (Signed)
Unable to leave VM due to mail box is full.  Dm/cma  

## 2022-08-14 DIAGNOSIS — G8929 Other chronic pain: Secondary | ICD-10-CM | POA: Diagnosis not present

## 2022-08-14 DIAGNOSIS — M25562 Pain in left knee: Secondary | ICD-10-CM | POA: Diagnosis not present

## 2022-08-14 DIAGNOSIS — E119 Type 2 diabetes mellitus without complications: Secondary | ICD-10-CM | POA: Diagnosis not present

## 2022-08-14 DIAGNOSIS — M1712 Unilateral primary osteoarthritis, left knee: Secondary | ICD-10-CM | POA: Diagnosis not present

## 2022-08-14 DIAGNOSIS — Z6841 Body Mass Index (BMI) 40.0 and over, adult: Secondary | ICD-10-CM | POA: Diagnosis not present

## 2022-08-19 ENCOUNTER — Telehealth: Payer: Self-pay

## 2022-08-19 MED ORDER — PIOGLITAZONE HCL 15 MG PO TABS: 15.0000 mg | ORAL_TABLET | Freq: Every day | ORAL | 0 refills | Status: DC

## 2022-08-19 NOTE — Telephone Encounter (Signed)
Spoke to patient and she states when she took the Actos 30 mg it made her feel weird and was unable to sleep.  Will send a new RX for 15 mg to pharmacy till Provider returns.  Dm/cma

## 2022-08-20 NOTE — Telephone Encounter (Signed)
Spoke to patient and she has seen another provider in [facility] office. Dm/cma

## 2022-08-21 DIAGNOSIS — G8929 Other chronic pain: Secondary | ICD-10-CM | POA: Diagnosis not present

## 2022-08-21 DIAGNOSIS — E119 Type 2 diabetes mellitus without complications: Secondary | ICD-10-CM | POA: Diagnosis not present

## 2022-08-21 DIAGNOSIS — M25561 Pain in right knee: Secondary | ICD-10-CM | POA: Diagnosis not present

## 2022-08-21 DIAGNOSIS — M7052 Other bursitis of knee, left knee: Secondary | ICD-10-CM | POA: Diagnosis not present

## 2022-08-21 DIAGNOSIS — M170 Bilateral primary osteoarthritis of knee: Secondary | ICD-10-CM | POA: Diagnosis not present

## 2022-08-22 DIAGNOSIS — M7052 Other bursitis of knee, left knee: Secondary | ICD-10-CM | POA: Insufficient documentation

## 2022-08-22 DIAGNOSIS — M170 Bilateral primary osteoarthritis of knee: Secondary | ICD-10-CM | POA: Diagnosis not present

## 2022-08-29 ENCOUNTER — Other Ambulatory Visit: Payer: Self-pay | Admitting: Family Medicine

## 2022-08-29 DIAGNOSIS — F411 Generalized anxiety disorder: Secondary | ICD-10-CM

## 2022-08-29 NOTE — Telephone Encounter (Signed)
Caller Name: Kylena Mole Call back phone #: [phone]   MEDICATION(S):  Lorazapam  Preferred Pharmacy:   [id number] [place], [place]  Phone:  [phone]  Fax:  [phone]  ~~~Please advise patient/caregiver to allow 2-3 business days to process RX refills.

## 2022-08-30 NOTE — Telephone Encounter (Signed)
Refill request for  Lorazepam  1 mg LR 05/31/22, #90, 2 rf LOV 9/20.23 FOV 10/23/22  Please review and advise.  Thanks. Dm/cma

## 2022-08-30 NOTE — Telephone Encounter (Signed)
Spoke to patient and she picked up RX for Diazepam (Vaginal Suppository) once daily by Dr Amalia Hailey for her IC.    She was advised that it wouldn't be filled till provider gets back.  Dm/cma

## 2022-09-03 ENCOUNTER — Telehealth: Payer: Self-pay | Admitting: Family Medicine

## 2022-09-03 NOTE — Telephone Encounter (Signed)
Pt is checking status of her LORazepam (ATIVAN) 1 MG tablet [[id number]]. Please advise pt @ [phone]

## 2022-09-04 NOTE — Telephone Encounter (Signed)
Unable to leave VM.  Will need a appointment to discuss this with Dr Gena Fray. Dm/cma

## 2022-09-04 NOTE — Telephone Encounter (Signed)
Please see other message Dm/cma  

## 2022-09-06 NOTE — Telephone Encounter (Signed)
Scheduled her an appointment for 09/25/22.  That was the soonest available. Advised to call next week to see if there has been any cancellations. Dm/cma

## 2022-09-07 ENCOUNTER — Other Ambulatory Visit: Payer: Self-pay | Admitting: Family Medicine

## 2022-09-07 DIAGNOSIS — E1140 Type 2 diabetes mellitus with diabetic neuropathy, unspecified: Secondary | ICD-10-CM

## 2022-09-23 DIAGNOSIS — N3010 Interstitial cystitis (chronic) without hematuria: Secondary | ICD-10-CM | POA: Diagnosis not present

## 2022-09-23 DIAGNOSIS — M797 Fibromyalgia: Secondary | ICD-10-CM | POA: Diagnosis not present

## 2022-09-25 ENCOUNTER — Encounter: Payer: Self-pay | Admitting: Family Medicine

## 2022-09-25 ENCOUNTER — Ambulatory Visit: Payer: Medicaid Other | Admitting: Family Medicine

## 2022-09-25 VITALS — BP 122/80 | HR 103 | Temp 97.0°F | Ht 66.5 in | Wt 253.0 lb

## 2022-09-25 DIAGNOSIS — M170 Bilateral primary osteoarthritis of knee: Secondary | ICD-10-CM

## 2022-09-25 DIAGNOSIS — I10 Essential (primary) hypertension: Secondary | ICD-10-CM

## 2022-09-25 DIAGNOSIS — Z6841 Body Mass Index (BMI) 40.0 and over, adult: Secondary | ICD-10-CM | POA: Diagnosis not present

## 2022-09-25 DIAGNOSIS — F5101 Primary insomnia: Secondary | ICD-10-CM

## 2022-09-25 DIAGNOSIS — F411 Generalized anxiety disorder: Secondary | ICD-10-CM | POA: Diagnosis not present

## 2022-09-25 DIAGNOSIS — E1140 Type 2 diabetes mellitus with diabetic neuropathy, unspecified: Secondary | ICD-10-CM | POA: Diagnosis not present

## 2022-09-25 DIAGNOSIS — B370 Candidal stomatitis: Secondary | ICD-10-CM

## 2022-09-25 MED ORDER — LORAZEPAM 1 MG PO TABS: 1.0000 mg | ORAL_TABLET | Freq: Three times a day (TID) | ORAL | 2 refills | Status: DC

## 2022-09-25 MED ORDER — PIOGLITAZONE HCL 30 MG PO TABS: 30.0000 mg | ORAL_TABLET | Freq: Every day | ORAL | 3 refills | Status: AC

## 2022-09-25 MED ORDER — NYSTATIN 100000 UNIT/ML MT SUSP: 5.0000 mL | Freq: Four times a day (QID) | OROMUCOSAL | 0 refills | Status: AC

## 2022-09-25 MED ORDER — OZEMPIC (0.25 OR 0.5 MG/DOSE) 2 MG/3ML ~~LOC~~ SOPN: PEN_INJECTOR | SUBCUTANEOUS | 1 refills | Status: AC

## 2022-09-25 MED ORDER — ZOLPIDEM TARTRATE 5 MG PO TABS: 5.0000 mg | ORAL_TABLET | Freq: Every evening | ORAL | 2 refills | Status: AC | PRN

## 2022-09-25 NOTE — Progress Notes (Signed)
[facility] [address] [place] [address] Dept: [phone] Dept Fax: [phone]  Chronic Care Office Visit  Subjective:    Patient ID: Brandi Welch, female    DOB: [birth year], 46 y.o..   MRN: [id number]  Chief Complaint  Patient presents with   Follow-up    F/u meds. No concerns.     History of Present Illness:  Patient is in today for reassessment of chronic medical issues.  Brandi Welch has a history of Type 2 diabetes. She has been managed on metformin 1,000 mg twice a day and pioglitazone 30 mg daily. The pioglitazone dose was increased after the last A1c in Sept. remained above goal. She continues to struggle with obesity as well. Her physical activity has been limited by bilateral knee arthritis. She has been seeing an orthopedist, who has been giving her knee joint steroid injections. These are temporarily helpful. She notes she was not able to get approved for Synvisc. She knows she needs to loose weight to offload her knees.   Brandi Welch also has a history of hypertension. She is managed on irbesartan 75 mg daily.   Brandi Welch lorazepam 1 mg as needed for panic attacks.  She also uses periodic Ambien for insomnia.  Brandi Welch complains of a burning sensation in her mouth and down her throat. She associates this with a recent course of prednisone. She states she has been vigorously scrubbing her tongue. She believes she may have thrush.  Past Medical History: Patient Active Problem List   Diagnosis Date Noted   Pes anserinus bursitis of left knee 08/22/2022   Mild intermittent asthma without complication 27/25/3664   Primary insomnia 04/26/2022   Diabetic neuropathy ([place]) 10/10/2021   Gastroesophageal reflux disease without esophagitis 07/17/2021   Class 3 severe obesity due to excess calories with serious comorbidity and body mass index (BMI) of 40.0 to 44.9 in adult ([place]) 02/07/2021   Bipolar  disorder ([place]) 01/10/2021   Zoster 01/10/2021   Type 2 diabetes mellitus with diabetic neuropathy, unspecified ([place]) 01/10/2021   Vitamin D deficiency 01/10/2021   Chronic constipation 01/10/2021   Hematochezia 01/10/2021   History of substance abuse ([place]) 05/04/2018   Hyperlipidemia 05/04/2018   AKI (acute kidney injury) ([place]) 05/04/2018   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   History of repeated overdose 10/08/2017   Chronic migraine 08/06/2017   History of opioid abuse ([place]) 01/13/2016   Bilateral primary osteoarthritis of knee 04/12/2015   Allergic rhinitis 04/12/2015   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   Interstitial cystitis (chronic) without hematuria 06/21/2014   Generalized anxiety disorder 11/30/2012   Low back pain 01/22/2012   Myofascial pain 01/22/2012   Essential hypertension 12/18/2011   Fibromyalgia 06/27/2011   Past Surgical History:  Procedure Laterality Date   BRAIN SURGERY     CERVICAL BIOPSY  W/ LOOP ELECTRODE EXCISION     NASAL SEPTUM SURGERY     NOSE SURGERY     Family History  Problem Relation Age of Onset   Hypertension Mother    Hyperlipidemia Mother    Asthma Mother    Arthritis Mother    Diabetes Father    Hypertension Father    Hyperlipidemia Father    Stroke Maternal Grandmother    Outpatient Medications Prior to Visit  Medication Sig Dispense Refill   Accu-Chek Softclix Lancets lancets Use as instructed 100 each 12   acetaminophen (TYLENOL) 500 MG tablet Take 1,000 mg by mouth every 6 (six) hours as  needed for mild pain, moderate pain, fever or headache.     acyclovir (ZOVIRAX) 200 MG capsule TAKE 1 CAPSULE BY MOUTH TWICE A DAY 180 capsule 3   albuterol (VENTOLIN HFA) 108 (90 Base) MCG/ACT inhaler Inhale 2 puffs into the lungs every 6 (six) hours as needed for wheezing or shortness of breath. 8 g 2   atorvastatin (LIPITOR) 40 MG tablet TAKE 1 TABLET BY MOUTH EVERY DAY 90 tablet 3   bimatoprost (LATISSE) 0.03 % ophthalmic  solution Place 1 drop into both eyes 2 times daily. Place one drop on applicator and apply evenly along the skin of the upper eyelid at base of eyelashes once daily at bedtime; repeat procedure for second eye (use a clean applicator).     Blood Glucose Monitoring Suppl (BLOOD GLUCOSE MONITOR SYSTEM) w/Device KIT 1 Units by Does not apply route daily. DX: Diabetes;  please fill with whichever one insurance covers. 1 kit 0   cetirizine (ZYRTEC) 10 MG tablet Take 1 tablet by mouth daily.     cholecalciferol (VITAMIN D) 1000 units tablet Take 2,000 Units by mouth daily.     cloNIDine (CATAPRES) 0.2 MG tablet TAKE 1 TABLET BY MOUTH 3 TIMES DAILY. 270 tablet 3   diazepam (DIASTAT) 2.5 MG GEL Place 1 mg rectally once.     docusate sodium (COLACE) 100 MG capsule Take 100 mg by mouth 2 (two) times daily.     ELMIRON 100 MG capsule Take 100 mg by mouth 3 (three) times daily.     famotidine (PEPCID) 40 MG/5ML suspension Take by mouth daily.     gabapentin (NEURONTIN) 300 MG capsule Take 300 mg by mouth 3 (three) times daily.     glucose blood test strip 1 each by Other route as needed for other. Accu- check Guide test strips.   Use as instructed 100 each 1   irbesartan (AVAPRO) 75 MG tablet Take 1 tablet (75 mg total) by mouth daily. 90 tablet 3   metFORMIN (GLUCOPHAGE) 500 MG tablet TAKE 2 TABLETS BY MOUTH TWICE A DAY 360 tablet 5   Multiple Vitamins-Minerals (HAIR/SKIN/NAILS) TABS Take 3-4 tablets by mouth daily.     naproxen (NAPROSYN) 500 MG tablet Take by mouth.     nortriptyline (PAMELOR) 25 MG capsule Take 25 mg by mouth 2 (two) times daily.     ondansetron (ZOFRAN) 4 MG tablet TAKE 1 TABLET BY MOUTH EVERY 8 HOURS AS NEEDED FOR NAUSEA AND VOMITING 20 tablet 4   pioglitazone (ACTOS) 15 MG tablet Take 1 tablet (15 mg total) by mouth daily. 30 tablet 0   QUEtiapine (SEROQUEL) 100 MG tablet Take 1 tablet (100 mg total) by mouth in the morning. 90 tablet 3   QUEtiapine (SEROQUEL) 400 MG tablet TAKE 1  TABLET BY MOUTH EVERYDAY AT BEDTIME 90 tablet 0   LORazepam (ATIVAN) 1 MG tablet Take 1 tablet (1 mg total) by mouth 3 (three) times daily. 90 tablet 2   zolpidem (AMBIEN) 5 MG tablet TAKE 1 TABLET BY MOUTH AT BEDTIME AS NEEDED FOR SLEEP. 15 tablet 2   esomeprazole (NEXIUM) 40 MG capsule Take 1 capsule (40 mg total) by mouth 2 (two) times daily before a meal. (Patient not taking: Reported on 09/25/2022) 60 capsule 3   lidocaine (LIDODERM) 5 % Remove & Discard patch within 12 hours or as directed by MD (Patient not taking: Reported on 09/25/2022) 30 patch 2   amoxicillin-clavulanate (AUGMENTIN) 875-125 MG tablet Take 1 tablet by mouth 2 (two) times  daily. 20 tablet 0   pioglitazone (ACTOS) 30 MG tablet Take 1 tablet (30 mg total) by mouth daily. 90 tablet 3   No facility-administered medications prior to visit.   Allergies  Allergen Reactions   Shellfish Allergy Shortness Of Breath   Naloxone Diarrhea and Nausea And Vomiting    Objective:   Today's Vitals   09/25/22 1529  BP: 122/80  Pulse: (!) 103  Temp: (!) 97 F (36.1 C)  SpO2: 94%  Weight: 253 lb (114.8 kg)  Height: 5' 6.5" (1.689 m)   Body mass index is 40.22 kg/m.   General: Well developed, well nourished. No acute distress. HEENT: Normocephalic, non-traumatic. Mucous membranes moist. The tongue appears   beefy and red. There are no visible exudates. Oropharynx clear. Good dentition. Psych: Alert and oriented. Normal mood and affect.  Health Maintenance Due  Topic Date Due   COLONOSCOPY (Pts 45-18yr Insurance coverage will need to be confirmed)  Never done     Assessment & Plan:   1. Type 2 diabetes mellitus with diabetic neuropathy, without long-term current use of insulin (HCC) It is too soon to repeat the A1c today. However, as she has struggled to meet goals, I will go ahead and add semaglutide to her regimen. I will titrate up on her dose. I iwll have her check an A1c in 1 month and see me back in 2 months.  -  Semaglutide,0.25 or 0.5MG/DOS, (OZEMPIC, 0.25 OR 0.5 MG/DOSE,) 2 MG/3ML SOPN; Inject 0.25 mg into the skin once a week for 28 days, THEN 0.5 mg once a week for 28 days.  Dispense: 3 mL; Refill: 1 - Hemoglobin A1c; Future  2. Essential hypertension Blood pressure is at goal. Continue irbesartan 75 mg daily.  3. Class 3 severe obesity due to excess calories with serious comorbidity and body mass index (BMI) of 40.0 to 44.9 in adult (HCC) Maximum weight: 258 lbs (01/2021) Current weight: 253 lbs Weight change since last visit: + 3 lbs Total weight loss: 5 lbs (1.9%)  Hopefully Ozempic will be an adjunct to Ms. Mcgaha's weight loss efforts.  4. Bilateral primary osteoarthritis of knee Continue to follow with orthopedics.  5. Primary insomnia I will renew her Ambien.  - zolpidem (AMBIEN) 5 MG tablet; Take 1 tablet (5 mg total) by mouth at bedtime as needed. for sleep  Dispense: 15 tablet; Refill: 2  6. Generalized anxiety disorder I will renew her lorazepam  - LORazepam (ATIVAN) 1 MG tablet; Take 1 tablet (1 mg total) by mouth 3 (three) times daily.  Dispense: 90 tablet; Refill: 2  7. Oral thrush Although I do not see thrush today, she may have scrubbed the exudates off with her brushing. I cautioned her about not scrubbing too hard. I will prescribe Nystatin swich and swallow to see if this resolves her symptoms.  - nystatin (MYCOSTATIN) 100000 UNIT/ML suspension; Take 5 mLs (500,000 Units total) by mouth 4 (four) times daily. Swish in mouth and retain for as long as possible before swallowing.  Dispense: 60 mL; Refill: 0  Return in about 2 months (around 11/25/2022) for Reassessment.   SHaydee Salter MD

## 2022-09-27 ENCOUNTER — Other Ambulatory Visit (HOSPITAL_COMMUNITY): Payer: Self-pay

## 2022-09-30 ENCOUNTER — Other Ambulatory Visit: Payer: Medicaid Other

## 2022-10-04 DIAGNOSIS — K581 Irritable bowel syndrome with constipation: Secondary | ICD-10-CM | POA: Diagnosis not present

## 2022-10-04 DIAGNOSIS — R195 Other fecal abnormalities: Secondary | ICD-10-CM | POA: Diagnosis not present

## 2022-10-14 ENCOUNTER — Telehealth: Payer: Self-pay

## 2022-10-14 DIAGNOSIS — R110 Nausea: Secondary | ICD-10-CM | POA: Diagnosis not present

## 2022-10-14 DIAGNOSIS — Z20822 Contact with and (suspected) exposure to covid-19: Secondary | ICD-10-CM | POA: Diagnosis not present

## 2022-10-14 DIAGNOSIS — K5900 Constipation, unspecified: Secondary | ICD-10-CM | POA: Diagnosis not present

## 2022-10-14 DIAGNOSIS — D72829 Elevated white blood cell count, unspecified: Secondary | ICD-10-CM | POA: Diagnosis not present

## 2022-10-14 DIAGNOSIS — K5730 Diverticulosis of large intestine without perforation or abscess without bleeding: Secondary | ICD-10-CM | POA: Diagnosis not present

## 2022-10-14 DIAGNOSIS — K2970 Gastritis, unspecified, without bleeding: Secondary | ICD-10-CM | POA: Diagnosis not present

## 2022-10-14 DIAGNOSIS — R112 Nausea with vomiting, unspecified: Secondary | ICD-10-CM | POA: Diagnosis not present

## 2022-10-14 DIAGNOSIS — R1084 Generalized abdominal pain: Secondary | ICD-10-CM | POA: Diagnosis not present

## 2022-10-14 DIAGNOSIS — R079 Chest pain, unspecified: Secondary | ICD-10-CM | POA: Diagnosis not present

## 2022-10-14 NOTE — Telephone Encounter (Signed)
Transition Care Management Unsuccessful Follow-up Telephone Call  Date of discharge and from where:  10/14/22 [facility] ED  Attempts:  1st Attempt  Reason for unsuccessful TCM follow-up call:  Left voice message

## 2022-10-15 NOTE — Telephone Encounter (Signed)
Transition Care Management Unsuccessful Follow-up Telephone Call  Date of discharge and from where:  10/14/22 [facility] ED. Dx: unknown  Attempts:  2nd attempt  Reason for unsuccessful TCM follow-up call:  Left voice message

## 2022-10-16 NOTE — Telephone Encounter (Signed)
Transition Care Management Follow-up Telephone Call Date of discharge and from where: 10/14/22 [facility] ED. Dx: Constipation How have you been since you were released from the hospital? I've been better Any questions or concerns? Yes MRI was performed in the ED, pt states she was fully "backed up" and they diagnosed with constipation. Pt is having N/V and constipation accompanied by lower abdominal and back pain. What should she try at home.  Items Reviewed: Did the pt receive and understand the discharge instructions provided? Yes  Medications obtained and verified? No  Other? No  Any new allergies since your discharge? No  Dietary orders reviewed? Yes Do you have support at home? Yes   Home Care and Equipment/Supplies: Were home health services ordered? not applicable If so, what is the name of the agency? N/a  Has the agency set up a time to come to the patient's home? not applicable Were any new equipment or medical supplies ordered?  No What is the name of the medical supply agency? N/a Were you able to get the supplies/equipment? not applicable Do you have any questions related to the use of the equipment or supplies? No  Functional Questionnaire: (I = Independent and D = Dependent) ADLs: I  Bathing/Dressing- I  Meal Prep- I  Eating- I  Maintaining continence- I  Transferring/Ambulation- I  Managing Meds- I  Follow up appointments reviewed:  PCP Hospital f/u appt confirmed? Yes  Scheduled to see Dr. Veto Kemps on 10/17/22 @ 9:00am. Specialist Hospital f/u appt confirmed? No  Scheduled to see n/a on n/a @ n/a. Are transportation arrangements needed? No  If their condition worsens, is the pt aware to call PCP or go to the Emergency Dept.? Yes Was the patient provided with contact information for the PCP's office or ED? Yes Was to pt encouraged to call back with questions or concerns? Yes  Arvil Persons, RN, BSN RN Clinical Supervisor [facility] [employer]

## 2022-10-17 ENCOUNTER — Encounter: Payer: Medicaid Other | Admitting: Family Medicine

## 2022-10-17 ENCOUNTER — Telehealth: Payer: Self-pay | Admitting: Family Medicine

## 2022-10-17 DIAGNOSIS — F319 Bipolar disorder, unspecified: Secondary | ICD-10-CM

## 2022-10-17 NOTE — Telephone Encounter (Signed)
No show history - 06/04/2021, 03/08/2022, 07/09/2022, and 10/17/2022  3rd no show w/in 12 months (4 total), pt was called for VV today and did not answer, we are OON with insurance and pt has been asked 2x to change plan, unable to bill no show fee because of insurance.  Pt has another appointment scheduled for 10/23/2022. Please advise if you would like to proceed with dismissal and cancellation of 10/23/2022 appointment.

## 2022-10-18 ENCOUNTER — Encounter: Payer: Self-pay | Admitting: Family Medicine

## 2022-10-18 MED ORDER — QUETIAPINE FUMARATE 100 MG PO TABS: 100.0000 mg | ORAL_TABLET | Freq: Every morning | ORAL | 0 refills | Status: DC

## 2022-10-18 MED ORDER — QUETIAPINE FUMARATE 400 MG PO TABS: 400.0000 mg | ORAL_TABLET | Freq: Every day | ORAL | 0 refills | Status: DC

## 2022-10-18 NOTE — Telephone Encounter (Signed)
Called pt to notify of dismissal. Pt was very upset and states this comes at a bad time for her as she was just in the hospital. I advised her that she spoke with Porfirio Mylar, RN on 12/13 and was advised to come in for hosp f/u visit with Dr. Veto Kemps 12/14 and she did not show for the appointment. Pt asked about a 2nd chance and was advised of each missed appt with the office and that multiple chances had been given and prior discussion with Dr. Veto Kemps about missing appointments.   Pt understood that she will need to find a new PCP. Pt was advised I am mailing letter of dismissal.  Pt states she only has a few days of her Seroquel 400mg  and requesting 90 day refill to give time to find another PCP.  [id number] - [place], [place] - [address] Phone: [phone]  Fax: [phone]

## 2022-10-18 NOTE — Progress Notes (Signed)
This encounter was created in error - please disregard.

## 2022-10-23 ENCOUNTER — Ambulatory Visit: Payer: Medicaid Other | Admitting: Family Medicine

## 2022-11-11 ENCOUNTER — Other Ambulatory Visit: Payer: Self-pay | Admitting: Family Medicine

## 2022-11-11 DIAGNOSIS — R110 Nausea: Secondary | ICD-10-CM

## 2022-11-11 NOTE — Telephone Encounter (Signed)
Refill request for  Zofran 4 mg  LR  8/9-23, #20, 4 rf LOV 10/04/22 FOV 12/02/22  Please review and advise.   Thanks. Dm/cma

## 2022-11-17 DIAGNOSIS — K529 Noninfective gastroenteritis and colitis, unspecified: Secondary | ICD-10-CM | POA: Diagnosis not present

## 2022-11-17 DIAGNOSIS — R109 Unspecified abdominal pain: Secondary | ICD-10-CM | POA: Diagnosis not present

## 2022-11-17 DIAGNOSIS — K760 Fatty (change of) liver, not elsewhere classified: Secondary | ICD-10-CM | POA: Diagnosis not present

## 2022-11-17 DIAGNOSIS — M8700 Idiopathic aseptic necrosis of unspecified bone: Secondary | ICD-10-CM | POA: Diagnosis not present

## 2022-11-17 DIAGNOSIS — M4316 Spondylolisthesis, lumbar region: Secondary | ICD-10-CM | POA: Diagnosis not present

## 2022-11-17 DIAGNOSIS — K5901 Slow transit constipation: Secondary | ICD-10-CM | POA: Diagnosis not present

## 2022-11-29 ENCOUNTER — Telehealth: Payer: Self-pay

## 2022-11-29 NOTE — Telephone Encounter (Signed)
Received prior authorization approval from [facility] for Lidocaine Patches 5%.   Approved form 11/27/2022 to 11/29/23.

## 2022-12-02 ENCOUNTER — Ambulatory Visit: Payer: Medicaid Other | Admitting: Family Medicine

## 2022-12-03 ENCOUNTER — Other Ambulatory Visit: Payer: Self-pay | Admitting: Family Medicine

## 2022-12-03 DIAGNOSIS — F319 Bipolar disorder, unspecified: Secondary | ICD-10-CM

## 2022-12-06 DIAGNOSIS — M25571 Pain in right ankle and joints of right foot: Secondary | ICD-10-CM | POA: Diagnosis not present

## 2022-12-06 DIAGNOSIS — M25572 Pain in left ankle and joints of left foot: Secondary | ICD-10-CM | POA: Diagnosis not present

## 2023-01-01 DIAGNOSIS — N3010 Interstitial cystitis (chronic) without hematuria: Secondary | ICD-10-CM | POA: Diagnosis not present

## 2023-01-10 ENCOUNTER — Telehealth: Payer: Self-pay | Admitting: Family Medicine

## 2023-01-10 ENCOUNTER — Other Ambulatory Visit: Payer: Self-pay | Admitting: Family Medicine

## 2023-01-10 DIAGNOSIS — F319 Bipolar disorder, unspecified: Secondary | ICD-10-CM

## 2023-01-10 DIAGNOSIS — F411 Generalized anxiety disorder: Secondary | ICD-10-CM

## 2023-01-10 DIAGNOSIS — R112 Nausea with vomiting, unspecified: Secondary | ICD-10-CM | POA: Diagnosis not present

## 2023-01-10 DIAGNOSIS — J40 Bronchitis, not specified as acute or chronic: Secondary | ICD-10-CM | POA: Diagnosis not present

## 2023-01-10 DIAGNOSIS — Z20822 Contact with and (suspected) exposure to covid-19: Secondary | ICD-10-CM | POA: Diagnosis not present

## 2023-01-10 NOTE — Telephone Encounter (Signed)
Patient was dismissed form practice 11/2022. She called in today statin gthta she is having a hard time finding a new pcp and even getting an appointment at behavior health.She stated that she is having a really hard time and is really needing her medications. She would like to know if one refill of QUEtiapine (SEROQUEL) 400 MG tablet and LORazepam (ATIVAN) 1 MG tablet  could be called sent in for her until she finds a new doctor or get into behavior health?  [id number] [place] [place] [address]: [phone] Fax: [phone]

## 2023-01-13 ENCOUNTER — Other Ambulatory Visit: Payer: Self-pay | Admitting: Family Medicine

## 2023-01-13 DIAGNOSIS — F319 Bipolar disorder, unspecified: Secondary | ICD-10-CM

## 2023-01-13 MED ORDER — LORAZEPAM 1 MG PO TABS: 1.0000 mg | ORAL_TABLET | Freq: Three times a day (TID) | ORAL | 0 refills | Status: AC

## 2023-01-13 MED ORDER — QUETIAPINE FUMARATE 100 MG PO TABS: 100.0000 mg | ORAL_TABLET | Freq: Every morning | ORAL | 0 refills | Status: AC

## 2023-01-13 MED ORDER — QUETIAPINE FUMARATE 400 MG PO TABS: 400.0000 mg | ORAL_TABLET | Freq: Every day | ORAL | 0 refills | Status: AC

## 2023-01-13 NOTE — Addendum Note (Signed)
Addended by: Haydee Salter on: 01/13/2023 01:04 PM   Modules accepted: Orders

## 2023-01-15 DIAGNOSIS — N3010 Interstitial cystitis (chronic) without hematuria: Secondary | ICD-10-CM | POA: Diagnosis not present

## 2023-01-17 DIAGNOSIS — N3010 Interstitial cystitis (chronic) without hematuria: Secondary | ICD-10-CM | POA: Diagnosis not present

## 2023-01-19 ENCOUNTER — Other Ambulatory Visit: Payer: Self-pay | Admitting: Family Medicine

## 2023-01-19 DIAGNOSIS — E1140 Type 2 diabetes mellitus with diabetic neuropathy, unspecified: Secondary | ICD-10-CM

## 2023-01-21 DIAGNOSIS — K5900 Constipation, unspecified: Secondary | ICD-10-CM | POA: Diagnosis not present

## 2023-01-26 ENCOUNTER — Encounter (HOSPITAL_BASED_OUTPATIENT_CLINIC_OR_DEPARTMENT_OTHER): Payer: Self-pay | Admitting: Urology

## 2023-01-26 ENCOUNTER — Emergency Department (HOSPITAL_BASED_OUTPATIENT_CLINIC_OR_DEPARTMENT_OTHER)
Admission: EM | Admit: 2023-01-26 | Discharge: 2023-01-26 | Disposition: A | Discharge status: Home / Self Care | Payer: Medicaid Other | Attending: Emergency Medicine | Admitting: Emergency Medicine

## 2023-01-26 ENCOUNTER — Emergency Department (HOSPITAL_BASED_OUTPATIENT_CLINIC_OR_DEPARTMENT_OTHER): Payer: Medicaid Other

## 2023-01-26 ENCOUNTER — Other Ambulatory Visit: Payer: Self-pay

## 2023-01-26 DIAGNOSIS — R000 Tachycardia, unspecified: Secondary | ICD-10-CM | POA: Insufficient documentation

## 2023-01-26 DIAGNOSIS — R1030 Lower abdominal pain, unspecified: Secondary | ICD-10-CM | POA: Diagnosis present

## 2023-01-26 DIAGNOSIS — K529 Noninfective gastroenteritis and colitis, unspecified: Secondary | ICD-10-CM | POA: Insufficient documentation

## 2023-01-26 DIAGNOSIS — R0902 Hypoxemia: Secondary | ICD-10-CM | POA: Diagnosis not present

## 2023-01-26 DIAGNOSIS — R112 Nausea with vomiting, unspecified: Secondary | ICD-10-CM | POA: Insufficient documentation

## 2023-01-26 LAB — URINALYSIS, ROUTINE W REFLEX MICROSCOPIC
Bilirubin Urine: NEGATIVE
Glucose, UA: NEGATIVE mg/dL
Hgb urine dipstick: NEGATIVE
Ketones, ur: NEGATIVE mg/dL
Leukocytes,Ua: NEGATIVE
Nitrite: POSITIVE — AB
Protein, ur: NEGATIVE mg/dL
Specific Gravity, Urine: 1.01 (ref 1.005–1.030)
pH: 6.5 (ref 5.0–8.0)

## 2023-01-26 LAB — CBC WITH DIFFERENTIAL/PLATELET
Abs Immature Granulocytes: 0.05 10*3/uL (ref 0.00–0.07)
Basophils Absolute: 0.1 10*3/uL (ref 0.0–0.1)
Basophils Relative: 1 %
Eosinophils Absolute: 0.3 10*3/uL (ref 0.0–0.5)
Eosinophils Relative: 2 %
HCT: 37.6 % (ref 36.0–46.0)
Hemoglobin: 12.2 g/dL (ref 12.0–15.0)
Immature Granulocytes: 0 %
Lymphocytes Relative: 18 %
Lymphs Abs: 2.5 10*3/uL (ref 0.7–4.0)
MCH: 26.8 pg (ref 26.0–34.0)
MCHC: 32.4 g/dL (ref 30.0–36.0)
MCV: 82.5 fL (ref 80.0–100.0)
Monocytes Absolute: 1.1 10*3/uL — ABNORMAL HIGH (ref 0.1–1.0)
Monocytes Relative: 8 %
Neutro Abs: 10.3 10*3/uL — ABNORMAL HIGH (ref 1.7–7.7)
Neutrophils Relative %: 71 %
Platelets: 437 10*3/uL — ABNORMAL HIGH (ref 150–400)
RBC: 4.56 MIL/uL (ref 3.87–5.11)
RDW: 15.6 % — ABNORMAL HIGH (ref 11.5–15.5)
WBC: 14.3 10*3/uL — ABNORMAL HIGH (ref 4.0–10.5)
nRBC: 0 % (ref 0.0–0.2)

## 2023-01-26 LAB — LIPASE, BLOOD: Lipase: 25 U/L (ref 11–51)

## 2023-01-26 LAB — URINALYSIS, MICROSCOPIC (REFLEX)
RBC / HPF: NONE SEEN RBC/hpf (ref 0–5)
Squamous Epithelial / HPF: NONE SEEN /HPF (ref 0–5)
WBC, UA: NONE SEEN WBC/hpf (ref 0–5)

## 2023-01-26 LAB — COMPREHENSIVE METABOLIC PANEL
ALT: 14 U/L (ref 0–44)
AST: 14 U/L — ABNORMAL LOW (ref 15–41)
Albumin: 3.9 g/dL (ref 3.5–5.0)
Alkaline Phosphatase: 111 U/L (ref 38–126)
Anion gap: 10 (ref 5–15)
BUN: 9 mg/dL (ref 6–20)
CO2: 27 mmol/L (ref 22–32)
Calcium: 8.8 mg/dL — ABNORMAL LOW (ref 8.9–10.3)
Chloride: 98 mmol/L (ref 98–111)
Creatinine, Ser: 0.83 mg/dL (ref 0.44–1.00)
GFR, Estimated: >60 mL/min (ref >60)
Glucose, Bld: 164 mg/dL — ABNORMAL HIGH (ref 70–99)
Potassium: 3.5 mmol/L (ref 3.5–5.1)
Sodium: 135 mmol/L (ref 135–145)
Total Bilirubin: 0.4 mg/dL (ref 0.3–1.2)
Total Protein: 8 g/dL (ref 6.5–8.1)

## 2023-01-26 LAB — PREGNANCY, URINE: Preg Test, Ur: NEGATIVE

## 2023-01-26 MED ORDER — METRONIDAZOLE 500 MG PO TABS: 500.0000 mg | ORAL_TABLET | Freq: Three times a day (TID) | ORAL | 0 refills | Status: AC

## 2023-01-26 MED ORDER — IOHEXOL 300 MG/ML  SOLN
100.0000 mL | Freq: Once | INTRAMUSCULAR | Status: AC | PRN
  Administered 2023-01-26: 100 mL via INTRAVENOUS

## 2023-01-26 MED ORDER — ONDANSETRON HCL 4 MG/2ML IJ SOLN
4.0000 mg | Freq: Once | INTRAMUSCULAR | Status: AC
  Administered 2023-01-26: 4 mg via INTRAVENOUS
  Filled 2023-01-26: qty 2

## 2023-01-26 MED ORDER — CIPROFLOXACIN HCL 500 MG PO TABS: 500.0000 mg | ORAL_TABLET | Freq: Two times a day (BID) | ORAL | 0 refills | Status: AC

## 2023-01-26 MED ORDER — CIPROFLOXACIN HCL 500 MG PO TABS: 500.0000 mg | ORAL_TABLET | Freq: Once | ORAL | Status: DC

## 2023-01-26 MED ORDER — CIPROFLOXACIN IN D5W 400 MG/200ML IV SOLN
400.0000 mg | Freq: Once | INTRAVENOUS | Status: AC
  Administered 2023-01-26: 400 mg via INTRAVENOUS
  Filled 2023-01-26: qty 200

## 2023-01-26 MED ORDER — ONDANSETRON 4 MG PO TBDP: 4.0000 mg | ORAL_TABLET | Freq: Three times a day (TID) | ORAL | 0 refills | Status: AC | PRN

## 2023-01-26 MED ORDER — METRONIDAZOLE 500 MG PO TABS: 500.0000 mg | ORAL_TABLET | Freq: Once | ORAL | Status: DC

## 2023-01-26 MED ORDER — HYDROMORPHONE HCL 1 MG/ML IJ SOLN
1.0000 mg | Freq: Once | INTRAMUSCULAR | Status: AC
  Administered 2023-01-26: 1 mg via INTRAVENOUS
  Filled 2023-01-26: qty 1

## 2023-01-26 MED ORDER — METRONIDAZOLE 500 MG/100ML IV SOLN
500.0000 mg | Freq: Once | INTRAVENOUS | Status: AC
  Administered 2023-01-26: 500 mg via INTRAVENOUS
  Filled 2023-01-26: qty 100

## 2023-01-26 NOTE — ED Provider Notes (Signed)
[facility] Provider Note   CSN: [id number] Arrival date & time: 01/26/23  1726     History  Chief Complaint  Patient presents with   Constipation   Emesis    Brandi Welch is a 47 y.o. female with history of interstitial cystitis presenting to ED of abdominal pain.  Patient reports that she has had lower abdominal and pelvic pain ongoing since yesterday.  She reports bilateral lower abdominal pain, nausea, vomiting, difficulty with bowel movements all week and rockhard stool.  She has a history of interstitial cystitis but feels that this pain is different, although she is straining to urinate.  She also reports a history of kidney stones.  She denies vaginal discharge.  She denies any history of abdominal surgery.  L/       Home Medications Prior to Admission medications   Medication Sig Start Date End Date Taking? Authorizing Provider  ciprofloxacin (CIPRO) 500 MG tablet Take 1 tablet (500 mg total) by mouth every 12 (twelve) hours for 7 days. 01/27/23 02/03/23 Yes Tyson Masin, Carola Rhine, MD  metroNIDAZOLE (FLAGYL) 500 MG tablet Take 1 tablet (500 mg total) by mouth 3 (three) times daily for 7 days. 01/27/23 02/03/23 Yes Cynthea Zachman, Carola Rhine, MD  ondansetron (ZOFRAN-ODT) 4 MG disintegrating tablet Take 1 tablet (4 mg total) by mouth every 8 (eight) hours as needed for up to 12 doses for nausea or vomiting. 01/26/23  Yes Wyvonnia Dusky, MD  Accu-Chek Softclix Lancets lancets Use as instructed 10/10/21   Haydee Salter, MD  acetaminophen (TYLENOL) 500 MG tablet Take 1,000 mg by mouth every 6 (six) hours as needed for mild pain, moderate pain, fever or headache.    [provider]  acyclovir (ZOVIRAX) 200 MG capsule TAKE 1 CAPSULE BY MOUTH TWICE A DAY 01/30/22   Haydee Salter, MD  albuterol (VENTOLIN HFA) 108 (90 Base) MCG/ACT inhaler Inhale 2 puffs into the lungs every 6 (six) hours as needed for wheezing or shortness of breath.  05/02/22   Libby Maw, MD  atorvastatin (LIPITOR) 40 MG tablet TAKE 1 TABLET BY MOUTH EVERY DAY 06/10/22   Haydee Salter, MD  bimatoprost (LATISSE) 0.03 % ophthalmic solution Place 1 drop into both eyes 2 times daily. Place one drop on applicator and apply evenly along the skin of the upper eyelid at base of eyelashes once daily at bedtime; repeat procedure for second eye (use a clean applicator).    [provider]  Blood Glucose Monitoring Suppl (BLOOD GLUCOSE MONITOR SYSTEM) w/Device KIT 1 Units by Does not apply route daily. DX: Diabetes;  please fill with whichever one insurance covers. 10/22/21   Haydee Salter, MD  cetirizine (ZYRTEC) 10 MG tablet Take 1 tablet by mouth daily.    [provider]  cholecalciferol (VITAMIN D) 1000 units tablet Take 2,000 Units by mouth daily.    [provider]  cloNIDine (CATAPRES) 0.2 MG tablet TAKE 1 TABLET BY MOUTH 3 TIMES DAILY. 05/31/22   Haydee Salter, MD  diazepam (DIASTAT) 2.5 MG GEL Place 1 mg rectally once.    [provider]  docusate sodium (COLACE) 100 MG capsule Take 100 mg by mouth 2 (two) times daily.    [provider]  ELMIRON 100 MG capsule Take 100 mg by mouth 3 (three) times daily. 07/16/22   [provider]  esomeprazole (NEXIUM) 40 MG capsule Take 1 capsule (40 mg total) by mouth 2 (two)  times daily before a meal. Patient not taking: Reported on 09/25/2022 05/02/22   Libby Maw, MD  famotidine (PEPCID) 40 MG/5ML suspension Take by mouth daily.    [provider]  gabapentin (NEURONTIN) 300 MG capsule Take 300 mg by mouth 3 (three) times daily. 02/21/22   [provider]  glucose blood test strip 1 each by Other route as needed for other. Accu- check Guide test strips.   Use as instructed 11/07/21   Haydee Salter, MD  irbesartan (AVAPRO) 75 MG tablet Take 1 tablet (75 mg total) by mouth daily. 03/11/22   Haydee Salter, MD  lidocaine (LIDODERM) 5 %  Remove & Discard patch within 12 hours or as directed by MD Patient not taking: Reported on 09/25/2022 03/13/22   Haydee Salter, MD  LORazepam (ATIVAN) 1 MG tablet Take 1 tablet (1 mg total) by mouth 3 (three) times daily. 01/13/23   Haydee Salter, MD  metFORMIN (GLUCOPHAGE) 500 MG tablet TAKE 2 TABLETS BY MOUTH TWICE A DAY 04/01/22   Haydee Salter, MD  Multiple Vitamins-Minerals (HAIR/SKIN/NAILS) TABS Take 3-4 tablets by mouth daily.    [provider]  naproxen (NAPROSYN) 500 MG tablet Take by mouth. 09/03/22   [provider]  nortriptyline (PAMELOR) 25 MG capsule Take 25 mg by mouth 2 (two) times daily. 09/21/22   [provider]  nystatin (MYCOSTATIN) 100000 UNIT/ML suspension Take 5 mLs (500,000 Units total) by mouth 4 (four) times daily. Swish in mouth and retain for as long as possible before swallowing. 09/25/22   Haydee Salter, MD  ondansetron (ZOFRAN) 4 MG tablet TAKE 1 TABLET BY MOUTH EVERY 8 HOURS AS NEEDED FOR NAUSEA AND VOMITING 11/11/22   Haydee Salter, MD  pioglitazone (ACTOS) 30 MG tablet Take 1 tablet (30 mg total) by mouth daily. 09/25/22   Haydee Salter, MD  QUEtiapine (SEROQUEL) 100 MG tablet Take 1 tablet (100 mg total) by mouth in the morning. 01/13/23   Haydee Salter, MD  QUEtiapine (SEROQUEL) 400 MG tablet Take 1 tablet (400 mg total) by mouth at bedtime. 01/13/23   Haydee Salter, MD  zolpidem (AMBIEN) 5 MG tablet Take 1 tablet (5 mg total) by mouth at bedtime as needed. for sleep 09/25/22   Haydee Salter, MD      Allergies    Shellfish allergy and Naloxone    Review of Systems   Review of Systems  Physical Exam Updated Vital Signs BP 133/86   Pulse 100   Temp 98.4 F (36.9 C) (Oral)   Resp 16   Ht 5\' 6"  (1.676 m)   Wt 114.8 kg   LMP  (LMP Unknown)   SpO2 90%   BMI 40.85 kg/m  Physical Exam Constitutional:      General: She is not in acute distress. HENT:     Head: Normocephalic and atraumatic.  Eyes:      Conjunctiva/sclera: Conjunctivae normal.     Pupils: Pupils are equal, round, and reactive to light.  Cardiovascular:     Rate and Rhythm: Regular rhythm. Tachycardia present.  Pulmonary:     Effort: Pulmonary effort is normal. No respiratory distress.  Abdominal:     General: There is no distension.     Tenderness: There is abdominal tenderness in the right lower quadrant, suprapubic area and left lower quadrant.  Skin:    General: Skin is warm and dry.  Neurological:     General: No focal deficit  present.     Mental Status: She is alert. Mental status is at baseline.  Psychiatric:        Mood and Affect: Mood normal.        Behavior: Behavior normal.     ED Results / Procedures / Treatments   Labs (all labs ordered are listed, but only abnormal results are displayed) Labs Reviewed  COMPREHENSIVE METABOLIC PANEL - Abnormal; Notable for the following components:      Result Value   Glucose, Bld 164 (*)    Calcium 8.8 (*)    AST 14 (*)    All other components within normal limits  CBC WITH DIFFERENTIAL/PLATELET - Abnormal; Notable for the following components:   WBC 14.3 (*)    RDW 15.6 (*)    Platelets 437 (*)    Neutro Abs 10.3 (*)    Monocytes Absolute 1.1 (*)    All other components within normal limits  URINALYSIS, ROUTINE W REFLEX MICROSCOPIC - Abnormal; Notable for the following components:   Nitrite POSITIVE (*)    All other components within normal limits  URINALYSIS, MICROSCOPIC (REFLEX) - Abnormal; Notable for the following components:   Bacteria, UA FEW (*)    All other components within normal limits  URINE CULTURE  LIPASE, BLOOD  PREGNANCY, URINE    EKG None  Radiology DG Chest Portable 1 View  Result Date: 01/26/2023 CLINICAL DATA:  Hypoxia. EXAM: PORTABLE CHEST 1 VIEW COMPARISON:  Chest radiograph dated 01/21/2018. FINDINGS: The heart size and mediastinal contours are within normal limits. Both lungs are clear. The visualized skeletal structures  are unremarkable. IMPRESSION: No active disease. Electronically Signed   By: Anner Crete M.D.   On: 01/26/2023 21:39   CT ABDOMEN PELVIS W CONTRAST  Result Date: 01/26/2023 CLINICAL DATA:  LLQ pain EXAM: CT ABDOMEN AND PELVIS WITH CONTRAST TECHNIQUE: Multidetector CT imaging of the abdomen and pelvis was performed using the standard protocol following bolus administration of intravenous contrast. RADIATION DOSE REDUCTION: This exam was performed according to the departmental dose-optimization program which includes automated exposure control, adjustment of the mA and/or kV according to patient size and/or use of iterative reconstruction technique. CONTRAST:  122mL OMNIPAQUE IOHEXOL 300 MG/ML  SOLN COMPARISON:  11/17/2022 FINDINGS: Lower chest: No acute abnormality. Hepatobiliary: No focal liver abnormality is seen. No gallstones, gallbladder wall thickening, or biliary dilatation. Pancreas: Unremarkable. No pancreatic ductal dilatation or surrounding inflammatory changes. Spleen: Normal in size without focal abnormality. Adrenals/Urinary Tract: Adrenal glands are unremarkable. Kidneys are normal, without renal calculi, focal lesion, or hydronephrosis. Bladder is unremarkable. Stomach/Bowel: Unremarkable stomach. No bowel dilatation to suggest obstruction. There is thickening of the wall of the descending colon and sigmoid with pericolonic and peridiverticular fat stranding and fluid consistent with recurrent or residual colitis and diverticulitis. Vascular/Lymphatic: No significant vascular findings are present. Shotty retroperitoneal para-aortic nodes are identified. Reproductive: Status post hysterectomy. No adnexal masses. Other: No abdominal wall hernia or abnormality. No abdominopelvic ascites. Musculoskeletal: No acute or significant osseous findings. IMPRESSION: Findings consistent with diverticulitis and colitis descending colon and sigmoid. No evidence of abscess or obstruction. Electronically  Signed   By: Sammie Bench M.D.   On: 01/26/2023 20:11    Procedures Procedures    Medications Ordered in ED Medications  metroNIDAZOLE (FLAGYL) IVPB 500 mg (has no administration in time range)  HYDROmorphone (DILAUDID) injection 1 mg (1 mg Intravenous Given 01/26/23 1842)  ondansetron (ZOFRAN) injection 4 mg (4 mg Intravenous Given 01/26/23 1842)  iohexol (OMNIPAQUE) 300  MG/ML solution 100 mL (100 mLs Intravenous Contrast Given 01/26/23 1935)  ciprofloxacin (CIPRO) IVPB 400 mg (400 mg Intravenous New Bag/Given 01/26/23 2107)    ED Course/ Medical Decision Making/ A&P Clinical Course as of 01/26/23 2214  Brandi Welch Jan 26, 2023  2040 Patient was updated regarding her CT findings consistent with colitis.  We will do IV antibiotics as she is still feeling queasy in the ED, and also p.o. challenge her, but if she is keeping down fluids anticipate she can be discharged on oral antibiotics at home.  UA shows positive nitrites, few bacteria, we will send for urine culture.  Ciprofloxacin does have cross coverage for UTI regardless. [MT]  2212 After receiving antibiotics the patient was noted to have some transient borderline hypoxia, while at rest in the room.  Her O2 sats will drop as low as 88%.  However when I went in to speak to her and she is starting to take deep breaths, her O2 quickly comes up to 96 or 97%.  She tells me she has been a chronic smoker, and she quit 1 month ago.  She also may be experiencing sleep apnea.  I suspect this is likely a chronic ongoing issue with her oxygen saturation, not an acute process.  Her x-ray does not show any emergent findings.  I will place a referral to pulmonology, she may benefit even from a sleep medicine study, and she verbalized understanding and agreement with this follow-up plan. [MT]    Clinical Course User Index [MT] Cathren Sween, Carola Rhine, MD                             Medical Decision Making Amount and/or Complexity of Data Reviewed Labs:  ordered. Radiology: ordered.  Risk Prescription drug management.   This patient presents to the ED with concern for lower abdominal pain, constipation, nausea. This involves an extensive number of treatment options, and is a complaint that carries with it a high risk of complications and morbidity.  The differential diagnosis includes bowel obstruction versus cystitis versus ureteral colic versus other  Co-morbidities that complicate the patient evaluation: History of chronic constipation interstitial cystitis at high risk of complications  I ordered and personally interpreted labs.  The pertinent results include: Minor leukocytosis.  UA with possible urinary infection.  No other emergent findings  I ordered imaging studies including CT abdomen pelvis, x-ray of the chest I independently visualized and interpreted imaging which showed colitis and/or diverticulitis findings; no emergent intrathoracic findings I agree with the radiologist interpretation  The patient was maintained on a cardiac monitor.  I personally viewed and interpreted the cardiac monitored which showed an underlying rhythm of: Sinus rhythm and sinus tachycardia  I ordered medication including ciprofloxacin and Flagyl for colitis.  IV Dilaudid and Zofran for nausea and abdominal pain  I have reviewed the patients home medicines and have made adjustments as needed  Test Considered: Low suspicion for acute pulmonary embolism.  I do not see an indication for CT angiogram at this time   After the interventions noted above, I reevaluated the patient and found that they have: improved  Abdominal pain improved and tolerating p.o.  Dispostion:  After consideration of the diagnostic results and the patients response to treatment, I feel that the patent would benefit from close outpatient follow-up         Final Clinical Impression(s) / ED Diagnoses Final diagnoses:  Colitis    Rx /  DC Orders ED Discharge Orders           Ordered    ciprofloxacin (CIPRO) 500 MG tablet  Every 12 hours        01/26/23 2106    metroNIDAZOLE (FLAGYL) 500 MG tablet  3 times daily        01/26/23 2106    ondansetron (ZOFRAN-ODT) 4 MG disintegrating tablet  Every 8 hours PRN        01/26/23 2106    Ambulatory referral to Pulmonology       Comments: Borderline hypoxia, O2 sat to 88% at rest, improves with respiration, concern for potential OSA   01/26/23 2210              Wyvonnia Dusky, MD 01/26/23 2214

## 2023-01-26 NOTE — ED Notes (Signed)
Pt tolerating PO intake. Request to be discharged.   D/c paperwork reviewed with pt, including prescriptions and follow up care.  All questions and/or concerns addressed at time of d/c.  No further needs expressed. . Pt verbalized understanding, Ambulatory with family to ED exit, NAD.

## 2023-01-26 NOTE — Discharge Instructions (Addendum)
We talked about your oxygen level which is a drop lower while you are at rest in the bed.  This may be related to chronic medical conditions such as your smoking, or sleep apnea.  I placed a referral to pulmonology for this.  If you do not hear from their office within 3 business days, please call the number above to schedule follow-up appointment.

## 2023-01-26 NOTE — ED Notes (Signed)
Pt placed on 3L O2 via [place] due to O2 sats noted to be 88% on RA, good waveform noted. Resp Therapist and EDP Trifan made aware. Pts O2 sats increased to 95% on 3L.

## 2023-01-26 NOTE — ED Notes (Signed)
Lab notified to add-on urine culture to previously collected urine sample.  

## 2023-01-26 NOTE — ED Notes (Signed)
Pt provided apple sauce and water for PO challenge.

## 2023-01-26 NOTE — ED Triage Notes (Signed)
Pt states constipation x 1 week, hard rock style BM  States abdominal pain with N/V today  Denies fever

## 2023-01-28 LAB — URINE CULTURE: Culture: NO GROWTH

## 2023-02-01 ENCOUNTER — Other Ambulatory Visit: Payer: Self-pay | Admitting: Family Medicine

## 2023-02-01 DIAGNOSIS — F319 Bipolar disorder, unspecified: Secondary | ICD-10-CM

## 2023-02-03 ENCOUNTER — Other Ambulatory Visit: Payer: Self-pay | Admitting: Family Medicine

## 2023-02-03 DIAGNOSIS — F411 Generalized anxiety disorder: Secondary | ICD-10-CM

## 2023-02-03 DIAGNOSIS — F1329 Sedative, hypnotic or anxiolytic dependence with unspecified sedative, hypnotic or anxiolytic-induced disorder: Secondary | ICD-10-CM | POA: Diagnosis not present

## 2023-02-03 DIAGNOSIS — F319 Bipolar disorder, unspecified: Secondary | ICD-10-CM

## 2023-02-03 DIAGNOSIS — N3010 Interstitial cystitis (chronic) without hematuria: Secondary | ICD-10-CM | POA: Diagnosis not present

## 2023-02-05 ENCOUNTER — Other Ambulatory Visit: Payer: Self-pay | Admitting: Family Medicine

## 2023-02-05 DIAGNOSIS — F411 Generalized anxiety disorder: Secondary | ICD-10-CM

## 2023-02-05 DIAGNOSIS — F319 Bipolar disorder, unspecified: Secondary | ICD-10-CM

## 2023-02-07 DIAGNOSIS — R300 Dysuria: Secondary | ICD-10-CM | POA: Diagnosis not present

## 2023-02-18 DIAGNOSIS — M797 Fibromyalgia: Secondary | ICD-10-CM | POA: Diagnosis not present

## 2023-02-18 DIAGNOSIS — I10 Essential (primary) hypertension: Secondary | ICD-10-CM | POA: Diagnosis not present

## 2023-02-18 DIAGNOSIS — Z7689 Persons encountering health services in other specified circumstances: Secondary | ICD-10-CM | POA: Diagnosis not present

## 2023-02-18 DIAGNOSIS — R110 Nausea: Secondary | ICD-10-CM | POA: Diagnosis not present

## 2023-02-18 DIAGNOSIS — E782 Mixed hyperlipidemia: Secondary | ICD-10-CM | POA: Diagnosis not present

## 2023-02-18 DIAGNOSIS — F5104 Psychophysiologic insomnia: Secondary | ICD-10-CM | POA: Diagnosis not present

## 2023-02-18 DIAGNOSIS — E119 Type 2 diabetes mellitus without complications: Secondary | ICD-10-CM | POA: Diagnosis not present

## 2023-02-18 DIAGNOSIS — E039 Hypothyroidism, unspecified: Secondary | ICD-10-CM | POA: Diagnosis not present

## 2023-02-18 DIAGNOSIS — N3010 Interstitial cystitis (chronic) without hematuria: Secondary | ICD-10-CM | POA: Diagnosis not present

## 2023-02-26 DIAGNOSIS — N3000 Acute cystitis without hematuria: Secondary | ICD-10-CM | POA: Diagnosis not present

## 2023-03-06 DIAGNOSIS — E038 Other specified hypothyroidism: Secondary | ICD-10-CM | POA: Diagnosis not present

## 2023-03-06 DIAGNOSIS — L659 Nonscarring hair loss, unspecified: Secondary | ICD-10-CM | POA: Diagnosis not present

## 2023-03-06 DIAGNOSIS — R197 Diarrhea, unspecified: Secondary | ICD-10-CM | POA: Diagnosis not present

## 2023-03-07 DIAGNOSIS — R197 Diarrhea, unspecified: Secondary | ICD-10-CM | POA: Diagnosis not present

## 2023-03-12 DIAGNOSIS — Z79899 Other long term (current) drug therapy: Secondary | ICD-10-CM | POA: Diagnosis not present

## 2023-03-20 DIAGNOSIS — A0472 Enterocolitis due to Clostridium difficile, not specified as recurrent: Secondary | ICD-10-CM | POA: Diagnosis not present

## 2023-03-20 DIAGNOSIS — K5792 Diverticulitis of intestine, part unspecified, without perforation or abscess without bleeding: Secondary | ICD-10-CM | POA: Diagnosis not present

## 2023-03-21 DIAGNOSIS — F3162 Bipolar disorder, current episode mixed, moderate: Secondary | ICD-10-CM | POA: Diagnosis not present

## 2023-03-21 DIAGNOSIS — F411 Generalized anxiety disorder: Secondary | ICD-10-CM | POA: Diagnosis not present

## 2023-04-01 DIAGNOSIS — F411 Generalized anxiety disorder: Secondary | ICD-10-CM | POA: Diagnosis not present

## 2023-04-01 DIAGNOSIS — F3162 Bipolar disorder, current episode mixed, moderate: Secondary | ICD-10-CM | POA: Diagnosis not present

## 2023-04-02 DIAGNOSIS — Z0000 Encounter for general adult medical examination without abnormal findings: Secondary | ICD-10-CM | POA: Diagnosis not present

## 2023-04-02 DIAGNOSIS — F411 Generalized anxiety disorder: Secondary | ICD-10-CM | POA: Diagnosis not present

## 2023-04-02 DIAGNOSIS — E669 Obesity, unspecified: Secondary | ICD-10-CM | POA: Diagnosis not present

## 2023-04-02 DIAGNOSIS — Z780 Asymptomatic menopausal state: Secondary | ICD-10-CM | POA: Diagnosis not present

## 2023-04-02 DIAGNOSIS — R300 Dysuria: Secondary | ICD-10-CM | POA: Diagnosis not present

## 2023-04-02 DIAGNOSIS — E119 Type 2 diabetes mellitus without complications: Secondary | ICD-10-CM | POA: Diagnosis not present

## 2023-04-03 DIAGNOSIS — R300 Dysuria: Secondary | ICD-10-CM | POA: Diagnosis not present

## 2023-04-04 DIAGNOSIS — A0472 Enterocolitis due to Clostridium difficile, not specified as recurrent: Secondary | ICD-10-CM | POA: Diagnosis not present

## 2023-04-04 DIAGNOSIS — R1030 Lower abdominal pain, unspecified: Secondary | ICD-10-CM | POA: Diagnosis not present

## 2023-04-10 DIAGNOSIS — F411 Generalized anxiety disorder: Secondary | ICD-10-CM | POA: Diagnosis not present

## 2023-04-10 DIAGNOSIS — N3010 Interstitial cystitis (chronic) without hematuria: Secondary | ICD-10-CM | POA: Diagnosis not present

## 2023-04-10 DIAGNOSIS — E669 Obesity, unspecified: Secondary | ICD-10-CM | POA: Diagnosis not present

## 2023-04-10 DIAGNOSIS — E119 Type 2 diabetes mellitus without complications: Secondary | ICD-10-CM | POA: Diagnosis not present

## 2023-04-10 DIAGNOSIS — E7800 Pure hypercholesterolemia, unspecified: Secondary | ICD-10-CM | POA: Diagnosis not present

## 2023-04-24 DIAGNOSIS — F3162 Bipolar disorder, current episode mixed, moderate: Secondary | ICD-10-CM | POA: Diagnosis not present

## 2023-04-24 DIAGNOSIS — F411 Generalized anxiety disorder: Secondary | ICD-10-CM | POA: Diagnosis not present

## 2023-05-16 ENCOUNTER — Other Ambulatory Visit: Payer: Self-pay | Admitting: Family Medicine

## 2023-05-16 DIAGNOSIS — I10 Essential (primary) hypertension: Secondary | ICD-10-CM

## 2023-05-20 DIAGNOSIS — K219 Gastro-esophageal reflux disease without esophagitis: Secondary | ICD-10-CM | POA: Diagnosis not present

## 2023-05-20 DIAGNOSIS — B009 Herpesviral infection, unspecified: Secondary | ICD-10-CM | POA: Diagnosis not present

## 2023-05-20 DIAGNOSIS — I10 Essential (primary) hypertension: Secondary | ICD-10-CM | POA: Diagnosis not present

## 2023-05-20 DIAGNOSIS — F411 Generalized anxiety disorder: Secondary | ICD-10-CM | POA: Diagnosis not present

## 2023-05-20 DIAGNOSIS — M549 Dorsalgia, unspecified: Secondary | ICD-10-CM | POA: Diagnosis not present

## 2023-05-20 DIAGNOSIS — E119 Type 2 diabetes mellitus without complications: Secondary | ICD-10-CM | POA: Diagnosis not present

## 2023-05-20 DIAGNOSIS — M5136 Other intervertebral disc degeneration, lumbar region: Secondary | ICD-10-CM | POA: Diagnosis not present

## 2023-05-28 DIAGNOSIS — F411 Generalized anxiety disorder: Secondary | ICD-10-CM | POA: Diagnosis not present

## 2023-05-28 DIAGNOSIS — F3162 Bipolar disorder, current episode mixed, moderate: Secondary | ICD-10-CM | POA: Diagnosis not present

## 2023-06-14 ENCOUNTER — Other Ambulatory Visit: Payer: Self-pay | Admitting: Family Medicine

## 2023-06-14 DIAGNOSIS — K219 Gastro-esophageal reflux disease without esophagitis: Secondary | ICD-10-CM

## 2023-06-20 DIAGNOSIS — R0609 Other forms of dyspnea: Secondary | ICD-10-CM | POA: Diagnosis not present

## 2023-06-20 DIAGNOSIS — E559 Vitamin D deficiency, unspecified: Secondary | ICD-10-CM | POA: Diagnosis not present

## 2023-06-20 DIAGNOSIS — E7800 Pure hypercholesterolemia, unspecified: Secondary | ICD-10-CM | POA: Diagnosis not present

## 2023-06-20 DIAGNOSIS — E119 Type 2 diabetes mellitus without complications: Secondary | ICD-10-CM | POA: Diagnosis not present

## 2023-06-20 DIAGNOSIS — Z79899 Other long term (current) drug therapy: Secondary | ICD-10-CM | POA: Diagnosis not present

## 2023-06-20 DIAGNOSIS — R110 Nausea: Secondary | ICD-10-CM | POA: Diagnosis not present

## 2023-06-25 DIAGNOSIS — F3162 Bipolar disorder, current episode mixed, moderate: Secondary | ICD-10-CM | POA: Diagnosis not present

## 2023-06-25 DIAGNOSIS — F411 Generalized anxiety disorder: Secondary | ICD-10-CM | POA: Diagnosis not present

## 2023-07-01 DIAGNOSIS — H40013 Open angle with borderline findings, low risk, bilateral: Secondary | ICD-10-CM | POA: Diagnosis not present

## 2023-07-01 DIAGNOSIS — H04123 Dry eye syndrome of bilateral lacrimal glands: Secondary | ICD-10-CM | POA: Diagnosis not present

## 2023-07-01 DIAGNOSIS — H02881 Meibomian gland dysfunction right upper eyelid: Secondary | ICD-10-CM | POA: Diagnosis not present

## 2023-07-01 DIAGNOSIS — H02884 Meibomian gland dysfunction left upper eyelid: Secondary | ICD-10-CM | POA: Diagnosis not present

## 2023-07-01 DIAGNOSIS — E119 Type 2 diabetes mellitus without complications: Secondary | ICD-10-CM | POA: Diagnosis not present

## 2023-07-22 DIAGNOSIS — I10 Essential (primary) hypertension: Secondary | ICD-10-CM | POA: Diagnosis not present

## 2023-07-22 DIAGNOSIS — B9689 Other specified bacterial agents as the cause of diseases classified elsewhere: Secondary | ICD-10-CM | POA: Diagnosis not present

## 2023-07-22 DIAGNOSIS — J208 Acute bronchitis due to other specified organisms: Secondary | ICD-10-CM | POA: Diagnosis not present

## 2023-07-22 DIAGNOSIS — F319 Bipolar disorder, unspecified: Secondary | ICD-10-CM | POA: Diagnosis not present

## 2023-07-22 DIAGNOSIS — E119 Type 2 diabetes mellitus without complications: Secondary | ICD-10-CM | POA: Diagnosis not present

## 2023-07-22 DIAGNOSIS — Z1211 Encounter for screening for malignant neoplasm of colon: Secondary | ICD-10-CM | POA: Diagnosis not present

## 2023-07-22 DIAGNOSIS — R110 Nausea: Secondary | ICD-10-CM | POA: Diagnosis not present

## 2023-07-22 DIAGNOSIS — Z23 Encounter for immunization: Secondary | ICD-10-CM | POA: Diagnosis not present

## 2023-08-07 DIAGNOSIS — Z20822 Contact with and (suspected) exposure to covid-19: Secondary | ICD-10-CM | POA: Diagnosis not present

## 2023-08-07 DIAGNOSIS — R058 Other specified cough: Secondary | ICD-10-CM | POA: Diagnosis not present

## 2023-08-07 DIAGNOSIS — R0981 Nasal congestion: Secondary | ICD-10-CM | POA: Diagnosis not present

## 2023-08-07 DIAGNOSIS — J329 Chronic sinusitis, unspecified: Secondary | ICD-10-CM | POA: Diagnosis not present

## 2023-08-20 DIAGNOSIS — F3162 Bipolar disorder, current episode mixed, moderate: Secondary | ICD-10-CM | POA: Diagnosis not present

## 2023-08-20 DIAGNOSIS — F411 Generalized anxiety disorder: Secondary | ICD-10-CM | POA: Diagnosis not present

## 2023-08-26 DIAGNOSIS — J209 Acute bronchitis, unspecified: Secondary | ICD-10-CM | POA: Diagnosis not present

## 2023-08-26 DIAGNOSIS — R0600 Dyspnea, unspecified: Secondary | ICD-10-CM | POA: Diagnosis not present

## 2023-08-26 DIAGNOSIS — E7800 Pure hypercholesterolemia, unspecified: Secondary | ICD-10-CM | POA: Diagnosis not present

## 2023-08-26 DIAGNOSIS — R112 Nausea with vomiting, unspecified: Secondary | ICD-10-CM | POA: Diagnosis not present

## 2023-08-26 DIAGNOSIS — R0689 Other abnormalities of breathing: Secondary | ICD-10-CM | POA: Diagnosis not present

## 2023-08-26 DIAGNOSIS — E119 Type 2 diabetes mellitus without complications: Secondary | ICD-10-CM | POA: Diagnosis not present

## 2023-08-26 DIAGNOSIS — R059 Cough, unspecified: Secondary | ICD-10-CM | POA: Diagnosis not present

## 2023-09-08 DIAGNOSIS — E7800 Pure hypercholesterolemia, unspecified: Secondary | ICD-10-CM | POA: Diagnosis not present

## 2023-09-08 DIAGNOSIS — R112 Nausea with vomiting, unspecified: Secondary | ICD-10-CM | POA: Diagnosis not present

## 2023-09-08 DIAGNOSIS — J309 Allergic rhinitis, unspecified: Secondary | ICD-10-CM | POA: Diagnosis not present

## 2023-09-08 DIAGNOSIS — E611 Iron deficiency: Secondary | ICD-10-CM | POA: Diagnosis not present

## 2023-09-08 DIAGNOSIS — E119 Type 2 diabetes mellitus without complications: Secondary | ICD-10-CM | POA: Diagnosis not present

## 2023-09-19 DIAGNOSIS — Z201 Contact with and (suspected) exposure to tuberculosis: Secondary | ICD-10-CM | POA: Diagnosis not present

## 2023-09-19 DIAGNOSIS — J069 Acute upper respiratory infection, unspecified: Secondary | ICD-10-CM | POA: Diagnosis not present

## 2023-09-19 DIAGNOSIS — R059 Cough, unspecified: Secondary | ICD-10-CM | POA: Diagnosis not present

## 2023-09-19 DIAGNOSIS — R0602 Shortness of breath: Secondary | ICD-10-CM | POA: Diagnosis not present

## 2023-09-23 ENCOUNTER — Other Ambulatory Visit: Payer: Self-pay | Admitting: Family Medicine

## 2023-09-23 DIAGNOSIS — F319 Bipolar disorder, unspecified: Secondary | ICD-10-CM

## 2023-10-09 DIAGNOSIS — R5383 Other fatigue: Secondary | ICD-10-CM | POA: Diagnosis not present

## 2023-10-09 DIAGNOSIS — I10 Essential (primary) hypertension: Secondary | ICD-10-CM | POA: Diagnosis not present

## 2023-10-09 DIAGNOSIS — E119 Type 2 diabetes mellitus without complications: Secondary | ICD-10-CM | POA: Diagnosis not present

## 2023-10-09 DIAGNOSIS — R300 Dysuria: Secondary | ICD-10-CM | POA: Diagnosis not present

## 2023-10-09 DIAGNOSIS — Z79899 Other long term (current) drug therapy: Secondary | ICD-10-CM | POA: Diagnosis not present

## 2023-10-09 DIAGNOSIS — E559 Vitamin D deficiency, unspecified: Secondary | ICD-10-CM | POA: Diagnosis not present

## 2023-10-09 DIAGNOSIS — E7800 Pure hypercholesterolemia, unspecified: Secondary | ICD-10-CM | POA: Diagnosis not present

## 2023-10-14 DIAGNOSIS — F3162 Bipolar disorder, current episode mixed, moderate: Secondary | ICD-10-CM | POA: Diagnosis not present

## 2023-10-14 DIAGNOSIS — F411 Generalized anxiety disorder: Secondary | ICD-10-CM | POA: Diagnosis not present

## 2023-10-21 DIAGNOSIS — G500 Trigeminal neuralgia: Secondary | ICD-10-CM | POA: Diagnosis not present

## 2023-10-21 DIAGNOSIS — E119 Type 2 diabetes mellitus without complications: Secondary | ICD-10-CM | POA: Diagnosis not present

## 2023-10-21 DIAGNOSIS — I10 Essential (primary) hypertension: Secondary | ICD-10-CM | POA: Diagnosis not present

## 2023-10-30 DIAGNOSIS — R300 Dysuria: Secondary | ICD-10-CM | POA: Diagnosis not present

## 2023-10-30 DIAGNOSIS — B009 Herpesviral infection, unspecified: Secondary | ICD-10-CM | POA: Diagnosis not present

## 2023-11-12 DIAGNOSIS — F411 Generalized anxiety disorder: Secondary | ICD-10-CM | POA: Diagnosis not present

## 2023-11-12 DIAGNOSIS — F3162 Bipolar disorder, current episode mixed, moderate: Secondary | ICD-10-CM | POA: Diagnosis not present
